# Patient Record
Sex: Female | Born: 1950
Health system: Southern US, Community
[De-identification: ages and names within clinical notes are randomized; demographics above are authoritative.]

## PROBLEM LIST (undated history)

## (undated) DIAGNOSIS — G459 Transient cerebral ischemic attack, unspecified: Secondary | ICD-10-CM

## (undated) DIAGNOSIS — E785 Hyperlipidemia, unspecified: Secondary | ICD-10-CM

## (undated) DIAGNOSIS — D649 Anemia, unspecified: Secondary | ICD-10-CM

## (undated) DIAGNOSIS — I10 Essential (primary) hypertension: Secondary | ICD-10-CM

## (undated) HISTORY — DX: Anemia, unspecified: D64.9

## (undated) HISTORY — DX: Transient cerebral ischemic attack, unspecified: G45.9

## (undated) HISTORY — DX: Essential (primary) hypertension: I10

## (undated) HISTORY — DX: Hyperlipidemia, unspecified: E78.5

## (undated) HISTORY — PX: OTHER SURGICAL HISTORY: SHX169

---

## 1999-09-24 ENCOUNTER — Encounter: Admission: RE | Admit: 1999-09-24 | Discharge: 1999-09-24 | Payer: Self-pay | Admitting: Family Medicine

## 1999-09-24 ENCOUNTER — Encounter: Payer: Self-pay | Admitting: Family Medicine

## 2000-10-06 ENCOUNTER — Other Ambulatory Visit: Admission: RE | Admit: 2000-10-06 | Discharge: 2000-10-06 | Payer: Self-pay | Admitting: *Deleted

## 2000-10-06 ENCOUNTER — Other Ambulatory Visit: Admission: RE | Admit: 2000-10-06 | Discharge: 2000-10-06 | Payer: Self-pay | Admitting: Family Medicine

## 2000-10-10 ENCOUNTER — Encounter: Admission: RE | Admit: 2000-10-10 | Discharge: 2000-10-10 | Payer: Self-pay | Admitting: Family Medicine

## 2000-10-10 ENCOUNTER — Encounter: Payer: Self-pay | Admitting: Family Medicine

## 2001-10-09 ENCOUNTER — Other Ambulatory Visit: Admission: RE | Admit: 2001-10-09 | Discharge: 2001-10-09 | Payer: Self-pay | Admitting: Obstetrics and Gynecology

## 2001-10-20 ENCOUNTER — Encounter: Payer: Self-pay | Admitting: Family Medicine

## 2001-10-20 ENCOUNTER — Encounter: Admission: RE | Admit: 2001-10-20 | Discharge: 2001-10-20 | Payer: Self-pay | Admitting: Family Medicine

## 2002-10-23 ENCOUNTER — Encounter: Payer: Self-pay | Admitting: Family Medicine

## 2002-10-23 ENCOUNTER — Encounter: Admission: RE | Admit: 2002-10-23 | Discharge: 2002-10-23 | Payer: Self-pay | Admitting: Family Medicine

## 2002-10-25 ENCOUNTER — Other Ambulatory Visit: Admission: RE | Admit: 2002-10-25 | Discharge: 2002-10-25 | Payer: Self-pay | Admitting: *Deleted

## 2002-11-08 DIAGNOSIS — G459 Transient cerebral ischemic attack, unspecified: Secondary | ICD-10-CM

## 2002-11-08 HISTORY — DX: Transient cerebral ischemic attack, unspecified: G45.9

## 2003-03-29 ENCOUNTER — Ambulatory Visit (HOSPITAL_COMMUNITY): Admission: RE | Admit: 2003-03-29 | Discharge: 2003-03-29 | Payer: Self-pay | Admitting: Gastroenterology

## 2003-10-29 ENCOUNTER — Encounter: Admission: RE | Admit: 2003-10-29 | Discharge: 2003-10-29 | Payer: Self-pay | Admitting: Family Medicine

## 2003-11-12 ENCOUNTER — Other Ambulatory Visit: Admission: RE | Admit: 2003-11-12 | Discharge: 2003-11-12 | Payer: Self-pay | Admitting: *Deleted

## 2004-10-29 ENCOUNTER — Encounter: Admission: RE | Admit: 2004-10-29 | Discharge: 2004-10-29 | Payer: Self-pay | Admitting: *Deleted

## 2004-10-30 ENCOUNTER — Other Ambulatory Visit: Admission: RE | Admit: 2004-10-30 | Discharge: 2004-10-30 | Payer: Self-pay | Admitting: *Deleted

## 2005-11-04 ENCOUNTER — Encounter: Admission: RE | Admit: 2005-11-04 | Discharge: 2005-11-04 | Payer: Self-pay | Admitting: Family Medicine

## 2005-11-10 ENCOUNTER — Other Ambulatory Visit: Admission: RE | Admit: 2005-11-10 | Discharge: 2005-11-10 | Payer: Self-pay | Admitting: *Deleted

## 2006-11-07 ENCOUNTER — Encounter: Admission: RE | Admit: 2006-11-07 | Discharge: 2006-11-07 | Payer: Self-pay | Admitting: Family Medicine

## 2006-11-17 ENCOUNTER — Encounter: Admission: RE | Admit: 2006-11-17 | Discharge: 2006-11-17 | Payer: Self-pay | Admitting: Family Medicine

## 2006-11-25 ENCOUNTER — Other Ambulatory Visit: Admission: RE | Admit: 2006-11-25 | Discharge: 2006-11-25 | Payer: Self-pay | Admitting: *Deleted

## 2007-07-21 ENCOUNTER — Ambulatory Visit: Payer: Self-pay | Admitting: Internal Medicine

## 2007-07-21 ENCOUNTER — Ambulatory Visit: Payer: Self-pay | Admitting: *Deleted

## 2007-07-21 LAB — CONVERTED CEMR LAB
Albumin: 4.3 g/dL (ref 3.5–5.2)
Alkaline Phosphatase: 106 units/L (ref 39–117)
Basophils Absolute: 0 10*3/uL (ref 0.0–0.1)
CO2: 29 meq/L (ref 19–32)
Calcium: 9.7 mg/dL (ref 8.4–10.5)
Cholesterol: 195 mg/dL (ref 0–200)
Creatinine, Ser: 1.07 mg/dL (ref 0.40–1.20)
Glucose, Bld: 67 mg/dL — ABNORMAL LOW (ref 70–99)
Hemoglobin: 13 g/dL (ref 12.0–15.0)
LDL Cholesterol: 99 mg/dL (ref 0–99)
Lymphs Abs: 2.7 10*3/uL (ref 0.7–3.3)
MCV: 87.6 fL (ref 78.0–100.0)
Monocytes Absolute: 0.4 10*3/uL (ref 0.2–0.7)
Monocytes Relative: 7 % (ref 3–11)
Neutro Abs: 2 10*3/uL (ref 1.7–7.7)
Neutrophils Relative %: 39 % — ABNORMAL LOW (ref 43–77)
RBC: 4.53 M/uL (ref 3.87–5.11)
Sodium: 142 meq/L (ref 135–145)
Total CHOL/HDL Ratio: 3.1
Total Protein: 8 g/dL (ref 6.0–8.3)

## 2007-10-20 ENCOUNTER — Ambulatory Visit: Payer: Self-pay | Admitting: Internal Medicine

## 2007-11-15 ENCOUNTER — Ambulatory Visit (HOSPITAL_COMMUNITY): Admission: RE | Admit: 2007-11-15 | Discharge: 2007-11-15 | Payer: Self-pay | Admitting: Family Medicine

## 2008-01-02 ENCOUNTER — Encounter: Payer: Self-pay | Admitting: Family Medicine

## 2008-01-02 ENCOUNTER — Ambulatory Visit: Payer: Self-pay | Admitting: Internal Medicine

## 2008-01-02 LAB — CONVERTED CEMR LAB
CO2: 26 meq/L (ref 19–32)
Calcium: 9.7 mg/dL (ref 8.4–10.5)
Chloride: 104 meq/L (ref 96–112)
Creatinine, Ser: 0.88 mg/dL (ref 0.40–1.20)
Eosinophils Absolute: 0.1 10*3/uL (ref 0.0–0.7)
Eosinophils Relative: 2 % (ref 0–5)
Glucose, Bld: 97 mg/dL (ref 70–99)
HCT: 41.9 % (ref 36.0–46.0)
Hemoglobin: 13.3 g/dL (ref 12.0–15.0)
Neutrophils Relative %: 40 % — ABNORMAL LOW (ref 43–77)
Platelets: 258 10*3/uL (ref 150–400)
Sodium: 142 meq/L (ref 135–145)
WBC: 4.7 10*3/uL (ref 4.0–10.5)

## 2008-11-19 ENCOUNTER — Ambulatory Visit (HOSPITAL_COMMUNITY): Admission: RE | Admit: 2008-11-19 | Discharge: 2008-11-19 | Payer: Self-pay | Admitting: Family Medicine

## 2008-12-23 ENCOUNTER — Encounter (INDEPENDENT_AMBULATORY_CARE_PROVIDER_SITE_OTHER): Payer: Self-pay | Admitting: Adult Health

## 2008-12-23 ENCOUNTER — Ambulatory Visit: Payer: Self-pay | Admitting: Internal Medicine

## 2008-12-23 LAB — CONVERTED CEMR LAB
AST: 18 units/L (ref 0–37)
Albumin: 4.4 g/dL (ref 3.5–5.2)
Basophils Relative: 1 % (ref 0–1)
Calcium: 9.9 mg/dL (ref 8.4–10.5)
Eosinophils Absolute: 0.1 10*3/uL (ref 0.0–0.7)
HCT: 36.2 % (ref 36.0–46.0)
HDL: 60 mg/dL (ref >39)
LDL Cholesterol: 107 mg/dL — ABNORMAL HIGH (ref 0–99)
Lymphocytes Relative: 48 % — ABNORMAL HIGH (ref 12–46)
MCHC: 33.4 g/dL (ref 30.0–36.0)
Monocytes Absolute: 0.5 10*3/uL (ref 0.1–1.0)
Neutrophils Relative %: 41 % — ABNORMAL LOW (ref 43–77)
Platelets: 259 10*3/uL (ref 150–400)
Potassium: 3.9 meq/L (ref 3.5–5.3)
RBC: 4.14 M/uL (ref 3.87–5.11)
Sodium: 141 meq/L (ref 135–145)
TSH: 1.888 microintl units/mL (ref 0.350–4.50)
Total Protein: 8.1 g/dL (ref 6.0–8.3)
VLDL: 18 mg/dL (ref 0–40)

## 2009-07-08 ENCOUNTER — Encounter (INDEPENDENT_AMBULATORY_CARE_PROVIDER_SITE_OTHER): Payer: Self-pay | Admitting: Adult Health

## 2009-07-08 ENCOUNTER — Ambulatory Visit: Payer: Self-pay | Admitting: Internal Medicine

## 2009-07-08 LAB — CONVERTED CEMR LAB
ALT: 9 units/L (ref 0–35)
AST: 17 units/L (ref 0–37)
Albumin: 4.1 g/dL (ref 3.5–5.2)
Alkaline Phosphatase: 78 units/L (ref 39–117)
Calcium: 9.3 mg/dL (ref 8.4–10.5)
Creatinine, Ser: 0.98 mg/dL (ref 0.40–1.20)
Glucose, Bld: 87 mg/dL (ref 70–99)
Sodium: 143 meq/L (ref 135–145)
Total Protein: 7.5 g/dL (ref 6.0–8.3)
Triglycerides: 73 mg/dL (ref <150)

## 2009-08-01 ENCOUNTER — Ambulatory Visit: Payer: Self-pay | Admitting: Internal Medicine

## 2009-08-01 ENCOUNTER — Encounter (INDEPENDENT_AMBULATORY_CARE_PROVIDER_SITE_OTHER): Payer: Self-pay | Admitting: Adult Health

## 2009-08-15 ENCOUNTER — Ambulatory Visit: Payer: Self-pay | Admitting: Internal Medicine

## 2009-11-20 ENCOUNTER — Ambulatory Visit (HOSPITAL_COMMUNITY): Admission: RE | Admit: 2009-11-20 | Discharge: 2009-11-20 | Payer: Self-pay | Admitting: Internal Medicine

## 2009-12-26 ENCOUNTER — Encounter (INDEPENDENT_AMBULATORY_CARE_PROVIDER_SITE_OTHER): Payer: Self-pay | Admitting: Adult Health

## 2009-12-26 ENCOUNTER — Ambulatory Visit: Payer: Self-pay | Admitting: Family Medicine

## 2009-12-26 LAB — CONVERTED CEMR LAB
ALT: 11 units/L (ref 0–35)
AST: 20 units/L (ref 0–37)
Basophils Absolute: 0 10*3/uL (ref 0.0–0.1)
CO2: 26 meq/L (ref 19–32)
Chloride: 105 meq/L (ref 96–112)
Cholesterol: 159 mg/dL (ref 0–200)
Eosinophils Absolute: 0.1 10*3/uL (ref 0.0–0.7)
Eosinophils Relative: 2 % (ref 0–5)
GC Probe Amp, Genital: NEGATIVE
HDL: 58 mg/dL (ref >39)
LDL Cholesterol: 82 mg/dL (ref 0–99)
Lymphocytes Relative: 46 % (ref 12–46)
Lymphs Abs: 2 10*3/uL (ref 0.7–4.0)
MCHC: 31.7 g/dL (ref 30.0–36.0)
MCV: 90.9 fL (ref 78.0–100.0)
Monocytes Relative: 11 % (ref 3–12)
Neutro Abs: 1.7 10*3/uL (ref 1.7–7.7)
Platelets: 244 10*3/uL (ref 150–400)
Potassium: 4 meq/L (ref 3.5–5.3)
RDW: 13.6 % (ref 11.5–15.5)
Triglycerides: 94 mg/dL (ref <150)

## 2010-04-20 ENCOUNTER — Ambulatory Visit: Payer: Self-pay | Admitting: Adult Health

## 2010-05-12 ENCOUNTER — Ambulatory Visit: Payer: Self-pay | Admitting: Internal Medicine

## 2010-05-14 ENCOUNTER — Ambulatory Visit: Payer: Self-pay | Admitting: Internal Medicine

## 2010-11-23 ENCOUNTER — Ambulatory Visit (HOSPITAL_COMMUNITY)
Admission: RE | Admit: 2010-11-23 | Discharge: 2010-11-23 | Payer: Self-pay | Source: Home / Self Care | Attending: Internal Medicine | Admitting: Internal Medicine

## 2010-11-29 ENCOUNTER — Encounter: Payer: Self-pay | Admitting: Family Medicine

## 2010-12-10 ENCOUNTER — Other Ambulatory Visit: Payer: Self-pay | Admitting: Family Medicine

## 2010-12-11 ENCOUNTER — Encounter (INDEPENDENT_AMBULATORY_CARE_PROVIDER_SITE_OTHER): Payer: Self-pay | Admitting: *Deleted

## 2010-12-11 LAB — CONVERTED CEMR LAB
ALT: 9 units/L (ref 0–35)
AST: 18 units/L (ref 0–37)
Albumin: 3.9 g/dL (ref 3.5–5.2)
Alkaline Phosphatase: 85 units/L (ref 39–117)
BUN: 17 mg/dL (ref 6–23)
Calcium: 9.4 mg/dL (ref 8.4–10.5)
Chlamydia, DNA Probe: NEGATIVE
Potassium: 3.5 meq/L (ref 3.5–5.3)
Total Bilirubin: 0.3 mg/dL (ref 0.3–1.2)
Total CHOL/HDL Ratio: 2.6
Total Protein: 7.5 g/dL (ref 6.0–8.3)
Triglycerides: 63 mg/dL (ref <150)

## 2011-03-26 NOTE — Op Note (Signed)
   [name], [name]                          ACCOUNT NO.:  [id number]   MEDICAL RECORD NO.:  [id number]                   PATIENT TYPE:  AMB   LOCATION:  ENDO                                 FACILITY:  [facility]   PHYSICIAN:  Graylin Shiver, M.D.                DATE OF BIRTH:  October 28, 1951   DATE OF PROCEDURE:  03/29/2003  DATE OF DISCHARGE:                                 OPERATIVE REPORT   PROCEDURE:  Colonoscopy.   INDICATION FOR PROCEDURE:  Screening for colon cancer.  Informed consent was  obtained.   PREMEDICATION:  Fentanyl 60 mcg IV, Versed 5 mg IV.   DESCRIPTION OF PROCEDURE:  With the patient in the left lateral decubitus  position, a rectal exam was performed and no masses were felt.  The Olympus  colonoscope was inserted into the rectum and advanced around the colon to  the cecum.  Cecal landmarks were identified.  The cecum and ascending colon  were normal.  The transverse colon was normal.  The descending colon,  sigmoid, and rectum were normal.  She tolerated the procedure well without  complications.   IMPRESSION:  Normal colonoscopy to the cecum.                                                Graylin Shiver, M.D.    [name]  D:  03/29/2003  T:  03/30/2003  Job:  [id number]   cc:   Chales Salmon. Abigail Miyamoto, M.D.  [address]  [place]  [place] [address]  Fax: [phone]

## 2011-10-19 ENCOUNTER — Other Ambulatory Visit (HOSPITAL_COMMUNITY): Payer: Self-pay | Admitting: Family Medicine

## 2011-10-19 DIAGNOSIS — Z1231 Encounter for screening mammogram for malignant neoplasm of breast: Secondary | ICD-10-CM

## 2011-11-25 ENCOUNTER — Ambulatory Visit (HOSPITAL_COMMUNITY)
Admission: RE | Admit: 2011-11-25 | Discharge: 2011-11-25 | Disposition: A | Discharge status: Home / Self Care | Payer: Self-pay | Source: Ambulatory Visit | Attending: Family Medicine | Admitting: Family Medicine

## 2011-11-25 DIAGNOSIS — Z1231 Encounter for screening mammogram for malignant neoplasm of breast: Secondary | ICD-10-CM | POA: Insufficient documentation

## 2011-12-28 ENCOUNTER — Other Ambulatory Visit: Payer: Self-pay | Admitting: Family Medicine

## 2012-10-17 ENCOUNTER — Encounter: Payer: Self-pay | Admitting: Family Medicine

## 2012-10-17 ENCOUNTER — Ambulatory Visit (INDEPENDENT_AMBULATORY_CARE_PROVIDER_SITE_OTHER): Payer: Self-pay | Admitting: Family Medicine

## 2012-10-17 VITALS — BP 128/72 | HR 84 | Temp 98.4°F | Ht 62.0 in | Wt 114.7 lb

## 2012-10-17 DIAGNOSIS — E785 Hyperlipidemia, unspecified: Secondary | ICD-10-CM

## 2012-10-17 DIAGNOSIS — I10 Essential (primary) hypertension: Secondary | ICD-10-CM | POA: Insufficient documentation

## 2012-10-17 DIAGNOSIS — Z8673 Personal history of transient ischemic attack (TIA), and cerebral infarction without residual deficits: Secondary | ICD-10-CM

## 2012-10-17 DIAGNOSIS — R011 Cardiac murmur, unspecified: Secondary | ICD-10-CM | POA: Insufficient documentation

## 2012-10-17 MED ORDER — LISINOPRIL-HYDROCHLOROTHIAZIDE 20-25 MG PO TABS: 1.0000 | ORAL_TABLET | Freq: Every day | ORAL | Status: DC

## 2012-10-17 NOTE — Assessment & Plan Note (Signed)
No symptoms continue medical management.

## 2012-10-17 NOTE — Assessment & Plan Note (Signed)
Fasting labs at physical appointment. Continue crestor for now. May consider change based on [facility] pharmacy formulary.

## 2012-10-17 NOTE — Progress Notes (Signed)
  Subjective:    Patient ID: Angela [name], female    DOB: Dec 27, 1950, 61 y.o.   MRN: [id number]  HPI [facility].  1. Hx TIA. In 2004, records suggest normal evaluation, had an echo showing small PFO. Has been on aspirin (plavix was dc'd) since then without recurrences.  2. HLD. On crestor. Last labs show LDL 108, HDL 44, TG 75. Nonsmoker. Does walking, running, weights for exercise. Denies chest pain, dyspnea, med side effect.  3. HTN. Strong family history. Taking med, needs refills before she runs out of lisinopril-HCTZ. Denies vision problems, fatigue, leg edema.  Past Medical History  Diagnosis Date  . TIA (transient ischemic attack) 2004  . Hypertension   . Hyperlipidemia    History reviewed. No pertinent past surgical history.  Family History  Problem Relation Age of Onset  . Cancer Mother 78    gallbladder  . Hypertension Mother   . Heart disease Father   . Hypertension Father   . Hypertension Brother    History   Social History  . Marital Status: Single    Spouse Name: N/A    Number of Children: N/A  . Years of Education: N/A   Occupational History  . Not on file.   Social History Main Topics  . Smoking status: Never Smoker   . Smokeless tobacco: Not on file  . Alcohol Use: No  . Drug Use: No  . Sexually Active:    Other Topics Concern  . Not on file   Social History Narrative   Single. 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    Review of Systems See HPI otherwise negative.  reports that she has never smoked. She does not have any smokeless tobacco history on file.     Objective:   Physical Exam  Vitals reviewed. Constitutional: She is oriented to person, place, and time. She appears well-developed and well-nourished. No distress.  HENT:  Head: Normocephalic and atraumatic.  Mouth/Throat: Oropharynx is clear and moist.  Eyes: Conjunctivae  normal and EOM are normal. Pupils are equal, round, and reactive to light.  Neck: Normal range of motion. Neck supple. No thyromegaly present.  Cardiovascular: Normal rate and regular rhythm.   Murmur heard.      II/VI sys murmur near apex. No carotid radiation.  Pulmonary/Chest: Effort normal and breath sounds normal. No respiratory distress. She has no wheezes. She has no rales.  Abdominal: Soft. Bowel sounds are normal. She exhibits no distension. There is no tenderness. There is no rebound.  Musculoskeletal: She exhibits no edema and no tenderness.  Lymphadenopathy:    She has no cervical adenopathy.  Neurological: She is alert and oriented to person, place, and time. Coordination normal.  Skin: No rash noted. She is not diaphoretic.  Psychiatric: She has a normal mood and affect.       Assessment & Plan:

## 2012-10-17 NOTE — Patient Instructions (Addendum)
Nice to meet you. Please make appointment with Britta Mccreedy for orange card. We can do a physical once you get this. labwork after you get orange card. Keep up good work with exercise!  Health Maintenance, Females A healthy lifestyle and preventative care can promote health and wellness.  Maintain regular health, dental, and eye exams.  Eat a healthy diet. Foods like vegetables, fruits, whole grains, low-fat dairy products, and lean protein foods contain the nutrients you need without too many calories. Decrease your intake of foods high in solid fats, added sugars, and salt. Get information about a proper diet from your caregiver, if necessary.  Regular physical exercise is one of the most important things you can do for your health. Most adults should get at least 150 minutes of moderate-intensity exercise (any activity that increases your heart rate and causes you to sweat) each week. In addition, most adults need muscle-strengthening exercises on 2 or more days a week.   Maintain a healthy weight. The body mass index (BMI) is a screening tool to identify possible weight problems. It provides an estimate of body fat based on height and weight. Your caregiver can help determine your BMI, and can help you achieve or maintain a healthy weight. For adults 20 years and older:  A BMI below 18.5 is considered underweight.  A BMI of 18.5 to 24.9 is normal.  A BMI of 25 to 29.9 is considered overweight.  A BMI of 30 and above is considered obese.  Maintain normal blood lipids and cholesterol by exercising and minimizing your intake of saturated fat. Eat a balanced diet with plenty of fruits and vegetables. Blood tests for lipids and cholesterol should begin at age 9 and be repeated every 5 years. If your lipid or cholesterol levels are high, you are over 50, or you are a high risk for heart disease, you may need your cholesterol levels checked more frequently.Ongoing high lipid and cholesterol  levels should be treated with medicines if diet and exercise are not effective.  If you smoke, find out from your caregiver how to quit. If you do not use tobacco, do not start.  If you are pregnant, do not drink alcohol. If you are breastfeeding, be very cautious about drinking alcohol. If you are not pregnant and choose to drink alcohol, do not exceed 1 drink per day. One drink is considered to be 12 ounces (355 mL) of beer, 5 ounces (148 mL) of wine, or 1.5 ounces (44 mL) of liquor.  Avoid use of street drugs. Do not share needles with anyone. Ask for help if you need support or instructions about stopping the use of drugs.  High blood pressure causes heart disease and increases the risk of stroke. Blood pressure should be checked at least every 1 to 2 years. Ongoing high blood pressure should be treated with medicines, if weight loss and exercise are not effective.  If you are 52 to 60 years old, ask your caregiver if you should take aspirin to prevent strokes.  Diabetes screening involves taking a blood sample to check your fasting blood sugar level. This should be done once every 3 years, after age 46, if you are within normal weight and without risk factors for diabetes. Testing should be considered at a younger age or be carried out more frequently if you are overweight and have at least 1 risk factor for diabetes.  Breast cancer screening is essential preventative care for women. You should practice "breast self-awareness." This means understanding  the normal appearance and feel of your breasts and may include breast self-examination. Any changes detected, no matter how small, should be reported to a caregiver. Women in their 19s and 30s should have a clinical breast exam (CBE) by a caregiver as part of a regular health exam every 1 to 3 years. After age 91, women should have a CBE every year. Starting at age 72, women should consider having a mammogram (breast X-ray) every year. Women who  have a family history of breast cancer should talk to their caregiver about genetic screening. Women at a high risk of breast cancer should talk to their caregiver about having an MRI and a mammogram every year.  The Pap test is a screening test for cervical cancer. Women should have a Pap test starting at age 59. Between ages 53 and 1, Pap tests should be repeated every 2 years. Beginning at age 33, you should have a Pap test every 3 years as long as the past 3 Pap tests have been normal. If you had a hysterectomy for a problem that was not cancer or a condition that could lead to cancer, then you no longer need Pap tests. If you are between ages 60 and 47, and you have had normal Pap tests going back 10 years, you no longer need Pap tests. If you have had past treatment for cervical cancer or a condition that could lead to cancer, you need Pap tests and screening for cancer for at least 20 years after your treatment. If Pap tests have been discontinued, risk factors (such as a new sexual partner) need to be reassessed to determine if screening should be resumed. Some women have medical problems that increase the chance of getting cervical cancer. In these cases, your caregiver may recommend more frequent screening and Pap tests.  The human papillomavirus (HPV) test is an additional test that may be used for cervical cancer screening. The HPV test looks for the virus that can cause the cell changes on the cervix. The cells collected during the Pap test can be tested for HPV. The HPV test could be used to screen women aged 73 years and older, and should be used in women of any age who have unclear Pap test results. After the age of 75, women should have HPV testing at the same frequency as a Pap test.  Colorectal cancer can be detected and often prevented. Most routine colorectal cancer screening begins at the age of 104 and continues through age 84. However, your caregiver may recommend screening at an  earlier age if you have risk factors for colon cancer. On a yearly basis, your caregiver may provide home test kits to check for hidden blood in the stool. Use of a small camera at the end of a tube, to directly examine the colon (sigmoidoscopy or colonoscopy), can detect the earliest forms of colorectal cancer. Talk to your caregiver about this at age 51, when routine screening begins. Direct examination of the colon should be repeated every 5 to 10 years through age 15, unless early forms of pre-cancerous polyps or small growths are found.  Hepatitis C blood testing is recommended for all people born from [birth year] through 1965 and any individual with known risks for hepatitis C.  Practice safe sex. Use condoms and avoid high-risk sexual practices to reduce the spread of sexually transmitted infections (STIs). Sexually active women aged 15 and younger should be checked for Chlamydia, which is a common sexually transmitted infection. Older women  with new or multiple partners should also be tested for Chlamydia. Testing for other STIs is recommended if you are sexually active and at increased risk.  Osteoporosis is a disease in which the bones lose minerals and strength with aging. This can result in serious bone fractures. The risk of osteoporosis can be identified using a bone density scan. Women ages 79 and over and women at risk for fractures or osteoporosis should discuss screening with their caregivers. Ask your caregiver whether you should be taking a calcium supplement or vitamin D to reduce the rate of osteoporosis.  Menopause can be associated with physical symptoms and risks. Hormone replacement therapy is available to decrease symptoms and risks. You should talk to your caregiver about whether hormone replacement therapy is right for you.  Use sunscreen with a sun protection factor (SPF) of 30 or greater. Apply sunscreen liberally and repeatedly throughout the day. You should seek shade when your  shadow is shorter than you. Protect yourself by wearing long sleeves, pants, a wide-brimmed hat, and sunglasses year round, whenever you are outdoors.  Notify your caregiver of new moles or changes in moles, especially if there is a change in shape or color. Also notify your caregiver if a mole is larger than the size of a pencil eraser.  Stay current with your immunizations. Document Released: 05/10/2011 Document Revised: 01/17/2012 Document Reviewed: 05/10/2011 [facility] Patient Information 2013 [place], [place].

## 2012-10-17 NOTE — Assessment & Plan Note (Signed)
At goal today. Given rx for medication refills. Will check labs next visit after she gets orange card.

## 2012-10-18 ENCOUNTER — Other Ambulatory Visit: Payer: Self-pay | Admitting: Family Medicine

## 2012-11-09 ENCOUNTER — Other Ambulatory Visit: Payer: Self-pay | Admitting: Family Medicine

## 2012-11-09 MED ORDER — ROSUVASTATIN CALCIUM 20 MG PO TABS: 20.0000 mg | ORAL_TABLET | Freq: Every day | ORAL | Status: DC

## 2012-11-09 NOTE — Telephone Encounter (Signed)
Pt is needing refill on her Crestor (this one has not been filled by [facility] yet) and needs it called to [facility] [address]

## 2012-11-09 NOTE — Telephone Encounter (Signed)
rx sent to [facility]. Notify pt please.

## 2012-11-09 NOTE — Telephone Encounter (Signed)
Spoke with patient and informed her of below 

## 2012-11-13 ENCOUNTER — Other Ambulatory Visit: Payer: Self-pay | Admitting: Family Medicine

## 2012-11-13 ENCOUNTER — Telehealth: Payer: Self-pay | Admitting: Family Medicine

## 2012-11-13 MED ORDER — PRAVASTATIN SODIUM 40 MG PO TABS: 40.0000 mg | ORAL_TABLET | Freq: Every day | ORAL | Status: DC

## 2012-11-13 NOTE — Telephone Encounter (Signed)
Patient went to pick up the Rx for Crestor and it was too expensive and she is hoping that there is a generic instead.  Rosuvastatin Calcium is what she was getting when she was at [facility].  Please call patient to let her know if this can happen and what the name of the med to replace Crestor is.

## 2012-11-13 NOTE — Telephone Encounter (Signed)
Patient is calling back to check the status of the change in medication that she has requested.

## 2012-11-13 NOTE — Telephone Encounter (Signed)
Can change to pravastatin. Sent to [facility]. Please notify patient on 4$ list.

## 2012-11-14 NOTE — Telephone Encounter (Signed)
Spoke with patient and informed her of below 

## 2013-02-16 ENCOUNTER — Encounter (HOSPITAL_COMMUNITY): Payer: Self-pay

## 2013-02-16 ENCOUNTER — Emergency Department (HOSPITAL_COMMUNITY)
Admission: EM | Admit: 2013-02-16 | Discharge: 2013-02-16 | Disposition: A | Discharge status: Home / Self Care | Payer: No Typology Code available for payment source | Source: Home / Self Care | Attending: Family Medicine | Admitting: Family Medicine

## 2013-02-16 ENCOUNTER — Other Ambulatory Visit: Payer: Self-pay | Admitting: Family Medicine

## 2013-02-16 DIAGNOSIS — Z8673 Personal history of transient ischemic attack (TIA), and cerebral infarction without residual deficits: Secondary | ICD-10-CM

## 2013-02-16 DIAGNOSIS — R011 Cardiac murmur, unspecified: Secondary | ICD-10-CM

## 2013-02-16 DIAGNOSIS — Z1231 Encounter for screening mammogram for malignant neoplasm of breast: Secondary | ICD-10-CM

## 2013-02-16 DIAGNOSIS — E785 Hyperlipidemia, unspecified: Secondary | ICD-10-CM

## 2013-02-16 DIAGNOSIS — I10 Essential (primary) hypertension: Secondary | ICD-10-CM

## 2013-02-16 LAB — LIPID PANEL: Cholesterol: 156 mg/dL (ref 0–200)

## 2013-02-16 LAB — CBC
Hemoglobin: 11.6 g/dL — ABNORMAL LOW (ref 12.0–15.0)
MCH: 28.5 pg (ref 26.0–34.0)
MCV: 85 fL (ref 78.0–100.0)

## 2013-02-16 LAB — COMPREHENSIVE METABOLIC PANEL
ALT: 19 U/L (ref 0–35)
Albumin: 3.6 g/dL (ref 3.5–5.2)
Alkaline Phosphatase: 89 U/L (ref 39–117)
BUN: 19 mg/dL (ref 6–23)
GFR calc Af Amer: 78 mL/min — ABNORMAL LOW (ref >90)
Glucose, Bld: 90 mg/dL (ref 70–99)
Potassium: 4 mEq/L (ref 3.5–5.1)
Sodium: 140 mEq/L (ref 135–145)

## 2013-02-16 LAB — HEMOGLOBIN A1C: Hgb A1c MFr Bld: 5.9 % — ABNORMAL HIGH (ref <5.7)

## 2013-02-16 MED ORDER — PRAVASTATIN SODIUM 40 MG PO TABS: 40.0000 mg | ORAL_TABLET | Freq: Every day | ORAL | Status: DC

## 2013-02-16 MED ORDER — ROSUVASTATIN CALCIUM 20 MG PO TABS: 20.0000 mg | ORAL_TABLET | Freq: Every day | ORAL | Status: DC

## 2013-02-16 MED ORDER — LISINOPRIL-HYDROCHLOROTHIAZIDE 20-12.5 MG PO TABS: 1.0000 | ORAL_TABLET | Freq: Every day | ORAL | Status: DC

## 2013-02-16 NOTE — ED Notes (Signed)
Patient here to establish care and get a physical Has history of HTN High cholesterol

## 2013-02-16 NOTE — ED Provider Notes (Addendum)
History     CSN: [id number]  Arrival date & time 02/16/13  1020   First MD Initiated Contact with Patient 02/16/13 1036      Chief Complaint  Patient presents with  . Establish Care   HPI Pt is reporting that she is here to get established.  She has been going to family medicine but is reporting that she is coming here because they did not do anything over there.  She says that her HTN has been controlled and her hyperlipidemia needs to be checked.  No complaints and pt reports that she did get her orange discount card.     Past Medical History  Diagnosis Date  . TIA (transient ischemic attack) 2004  . Hypertension   . Hyperlipidemia     History reviewed. No pertinent past surgical history.  Family History  Problem Relation Age of Onset  . Cancer Mother 68    gallbladder  . Hypertension Mother   . Heart disease Father   . Hypertension Father   . Hypertension Brother     History  Substance Use Topics  . Smoking status: Never Smoker   . Smokeless tobacco: Not on file  . Alcohol Use: No    OB History   Grav Para Term Preterm Abortions TAB SAB Ect Mult Living                  Review of Systems  Constitutional: Negative.  HENT: Negative.  Respiratory: Negative.  Cardiovascular: Negative.  Gastrointestinal: Negative.  Endocrine: Negative.  Genitourinary: Negative.  Musculoskeletal: Negative.  Skin: Negative.  Allergic/Immunologic: Negative.  Neurological: Negative.  Hematological: Negative.  Psychiatric/Behavioral: Negative.  All other systems reviewed and are negative   Allergies  Review of patient's allergies indicates no known allergies.  Home Medications   Current Outpatient Rx  Name  Route  Sig  Dispense  Refill  . Multiple Vitamin (MULTIVITAMIN) tablet   Oral   Take 1 tablet by mouth daily.         . rosuvastatin (CRESTOR) 20 MG tablet   Oral   Take 20 mg by mouth daily.         Marland Kitchen aspirin 325 MG tablet   Oral   Take 325 mg by mouth  daily.         . calcium carbonate (OS-CAL) 600 MG TABS   Oral   Take 600 mg by mouth 2 (two) times daily with a meal.         . lisinopril-hydrochlorothiazide (PRINZIDE,ZESTORETIC) 20-25 MG per tablet   Oral   Take 1 tablet by mouth daily.   30 tablet   2   . pravastatin (PRAVACHOL) 40 MG tablet   Oral   Take 1 tablet (40 mg total) by mouth daily.   90 tablet   3     BP 125/76  Pulse 97  Temp(Src) 97.7 F (36.5 C) (Oral)  Resp 18  SpO2 100%  Physical Exam Nursing note and vitals reviewed.  Constitutional: She is oriented to person, place, and time. She appears well-developed and well-nourished. No distress.  HENT:  Head: Normocephalic and atraumatic.  Eyes: Conjunctivae and EOM are normal. Pupils are equal, round, and reactive to light.  Neck: Normal range of motion. Neck supple. No JVD present. No tracheal deviation present. No thyromegaly present.  Cardiovascular: Normal rate, regular rhythm and I-II/VI sys murmur  Pulmonary/Chest: Effort normal and breath sounds normal. No respiratory distress. She has no wheezes.  Abdominal: Soft. Bowel sounds  are normal.  Musculoskeletal: Normal range of motion. She exhibits no edema and no tenderness.  Lymphadenopathy:  She has no cervical adenopathy.  Neurological: She is alert and oriented to person, place, and time. She has normal reflexes.  Skin: Skin is warm and dry.  Psychiatric: She has a normal mood and affect. Her behavior is normal. Judgment and thought content normal.   ED Course  Procedures (including critical care time)  Labs Reviewed - No data to display No results found.  No diagnosis found.  MDM  IMPRESSION  Hypertension, controlled  History of hypokalemia   History of PFO  Hyperlipidemia  Health Maintenance  History of TIA   RECOMMENDATIONS / PLAN Pt not able to afford crestor but is going to [facility] to try and get the medication from [facility].  Pt will be given Rx for pravastatin so that  she can afford to take something until she gets her Crestor prescription.  STOP Pravastatin when she gets the crestor.  The patient verbalized understanding.  Follow up on lab results.  Refilled zestoretic 20/12.5 take 1 po daily, #30, RFx3.   Check labs today Continue daily aspirin  FOLLOW UP 4 months  The patient was given clear instructions to go to ER or return to medical center if symptoms don't improve, worsen or new problems develop.  The patient verbalized understanding.  The patient was told to call to get lab results if they haven't heard anything in the next week.           Cleora Fleet, MD 02/16/13 1106  Ilyse Tremain Cyndie Mull, MD 02/16/13 1109

## 2013-02-17 ENCOUNTER — Encounter: Payer: Self-pay | Admitting: *Deleted

## 2013-02-17 NOTE — Progress Notes (Signed)
  Subjective:    Patient ID: Angela [name], female    DOB: Oct 25, 1951, 62 y.o.   MRN: [id number]  HPI    Review of Systems     Objective:   Physical Exam        Assessment & Plan:  Since patient is not happy with care at [facility] and is a former [facility] patient, I ill remove Dr. Cristal Ford as PCP and make sure [facility].

## 2013-02-18 ENCOUNTER — Encounter: Payer: Self-pay | Admitting: Family Medicine

## 2013-02-18 DIAGNOSIS — R7303 Prediabetes: Secondary | ICD-10-CM | POA: Insufficient documentation

## 2013-02-18 NOTE — Progress Notes (Signed)
Quick Note:  Please inform patient that her Hemoglobin A1c came back elevated suggesting that she has prediabetes. Please mail patient information about prediabetes and recommend exercising 30 mins at least 5 times per week. Her hemoglobin level was a little low. Other labs OK. Please recheck labs in 3 months.   Rodney Langton, MD, CDE, FAAFP [facility] [place], [place]   ______

## 2013-02-19 ENCOUNTER — Telehealth (HOSPITAL_COMMUNITY): Payer: Self-pay

## 2013-02-19 NOTE — ED Notes (Signed)
Spoke with patient. Lab results given.

## 2013-02-19 NOTE — ED Notes (Signed)
[facility]

## 2013-02-26 ENCOUNTER — Telehealth (HOSPITAL_COMMUNITY): Payer: Self-pay | Admitting: *Deleted

## 2013-02-26 NOTE — ED Notes (Signed)
Pt. called on [facility] VM.  I called pt. back and told her this was the [facility]. She said [facility] has not called her with appt. for pap smear.  I accessed pt.'s chart and noted that [facility]. I told her I could see that they faxed the referral request on 4/14. I told the [facility] booked [facility] 2-3 mos., so it may take awhile for her to get seen there. I told her to call back [phone] in 2 weeks if she has not gotten her appointment. Vassie Moselle 02/26/2013

## 2013-02-28 ENCOUNTER — Ambulatory Visit (HOSPITAL_COMMUNITY)
Admission: RE | Admit: 2013-02-28 | Discharge: 2013-02-28 | Disposition: A | Discharge status: Home / Self Care | Payer: No Typology Code available for payment source | Source: Ambulatory Visit | Attending: Family Medicine | Admitting: Family Medicine

## 2013-02-28 DIAGNOSIS — Z1231 Encounter for screening mammogram for malignant neoplasm of breast: Secondary | ICD-10-CM | POA: Insufficient documentation

## 2013-03-21 ENCOUNTER — Ambulatory Visit (INDEPENDENT_AMBULATORY_CARE_PROVIDER_SITE_OTHER): Payer: No Typology Code available for payment source | Admitting: Obstetrics & Gynecology

## 2013-03-21 ENCOUNTER — Encounter: Payer: Self-pay | Admitting: Obstetrics & Gynecology

## 2013-03-21 DIAGNOSIS — Z01419 Encounter for gynecological examination (general) (routine) without abnormal findings: Secondary | ICD-10-CM

## 2013-03-21 NOTE — Patient Instructions (Signed)
Return to clinic for any scheduled appointments or for any gynecologic concerns as needed.   

## 2013-03-21 NOTE — Progress Notes (Signed)
Patient decided she is uptodate with her cervical dysplasia as she had three normal pap smears in last three years.  Will return as indicated.  No other GYN concerns.

## 2013-08-09 ENCOUNTER — Ambulatory Visit: Payer: No Typology Code available for payment source | Attending: Internal Medicine

## 2013-08-09 ENCOUNTER — Ambulatory Visit: Payer: No Typology Code available for payment source | Attending: Family Medicine | Admitting: Family Medicine

## 2013-08-09 VITALS — BP 151/79 | HR 81 | Temp 98.3°F | Resp 16 | Ht 62.0 in | Wt 112.0 lb

## 2013-08-09 DIAGNOSIS — R7303 Prediabetes: Secondary | ICD-10-CM

## 2013-08-09 DIAGNOSIS — I10 Essential (primary) hypertension: Secondary | ICD-10-CM

## 2013-08-09 DIAGNOSIS — E785 Hyperlipidemia, unspecified: Secondary | ICD-10-CM

## 2013-08-09 DIAGNOSIS — R011 Cardiac murmur, unspecified: Secondary | ICD-10-CM

## 2013-08-09 DIAGNOSIS — R7309 Other abnormal glucose: Secondary | ICD-10-CM

## 2013-08-09 MED ORDER — LISINOPRIL-HYDROCHLOROTHIAZIDE 20-12.5 MG PO TABS: 1.0000 | ORAL_TABLET | Freq: Every day | ORAL | Status: DC

## 2013-08-09 MED ORDER — SIMVASTATIN 20 MG PO TABS: 20.0000 mg | ORAL_TABLET | Freq: Every day | ORAL | Status: DC

## 2013-08-09 MED ORDER — FLUTICASONE PROPIONATE 50 MCG/ACT NA SUSP: 2.0000 | Freq: Every day | NASAL | Status: DC

## 2013-08-09 NOTE — Progress Notes (Signed)
Patient ID: Angela [name], female   DOB: 1951-08-07, 62 y.o.   MRN: [id number]  CC:  Follow up   HPI: Pt is presenting to follow up for her hypertension. She wants to switch to using this pharmacy.  She has some itching in her ears and wants to have it checked.  Pt requesting refills on her medications and pt is reporting no Cp or SOB.  She says that she has been well.    No Known Allergies Past Medical History  Diagnosis Date  . TIA (transient ischemic attack) 2004  . Hypertension   . Hyperlipidemia    Current Outpatient Prescriptions on File Prior to Visit  Medication Sig Dispense Refill  . aspirin 325 MG tablet Take 325 mg by mouth daily.      . calcium carbonate (OS-CAL) 600 MG TABS Take 600 mg by mouth 2 (two) times daily with a meal.      . Multiple Vitamin (MULTIVITAMIN) tablet Take 1 tablet by mouth daily.       No current facility-administered medications on file prior to visit.   Family History  Problem Relation Age of Onset  . Cancer Mother 61    gallbladder  . Hypertension Mother   . Heart disease Father   . Hypertension Father   . Hypertension Brother    History   Social History  . Marital Status: Single    Spouse Name: N/A    Number of Children: N/A  . Years of Education: N/A   Occupational History  . Not on file.   Social History Main Topics  . Smoking status: Never Smoker   . Smokeless tobacco: Not on file  . Alcohol Use: No  . Drug Use: No  . Sexual Activity:    Other Topics Concern  . Not on file   Social History Narrative   Single. 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     Review of Systems  Constitutional: Negative for fever, chills, diaphoresis, activity change, appetite change and fatigue.  HENT: Negative for ear pain, nosebleeds, congestion, facial swelling, rhinorrhea, neck pain, neck stiffness and ear discharge.   Eyes: Negative for pain, discharge, redness,  itching and visual disturbance.  Respiratory: Negative for cough, choking, chest tightness, shortness of breath, wheezing and stridor.   Cardiovascular: Negative for chest pain, palpitations and leg swelling.  Gastrointestinal: Negative for abdominal distention.  Genitourinary: Negative for dysuria, urgency, frequency, hematuria, flank pain, decreased urine volume, difficulty urinating and dyspareunia.  Musculoskeletal: Negative for back pain, joint swelling, arthralgias and gait problem.  Neurological: Negative for dizziness, tremors, seizures, syncope, facial asymmetry, speech difficulty, weakness, light-headedness, numbness and headaches.  Hematological: Negative for adenopathy. Does not bruise/bleed easily.  Psychiatric/Behavioral: Negative for hallucinations, behavioral problems, confusion, dysphoric mood, decreased concentration and agitation.    Objective:   Filed Vitals:   08/09/13 1722  BP: 151/79  Pulse: 81  Temp: 98.3 F (36.8 C)  Resp: 16    Physical Exam  Constitutional: Appears well-developed and well-nourished. No distress.  HENT: Normocephalic. External right and left ear canal with mod dry wax.  Swollen nasal turbinates bilateral. Oropharynx is clear and moist.  Eyes: Conjunctivae and EOM are normal. PERRLA, no scleral icterus.  Neck: Normal ROM. Neck supple. No JVD. No tracheal deviation. No thyromegaly.  CVS: RRR, S1/S2 +, no murmurs, no gallops, no carotid bruit.  Pulmonary: Effort and breath sounds normal, no stridor, rhonchi, wheezes, rales.  Abdominal: Soft. BS +,  no distension, tenderness, rebound or guarding.  Musculoskeletal: Normal range of motion. No edema and no tenderness.  Lymphadenopathy: No lymphadenopathy noted, cervical, inguinal. Neuro: Alert. Normal reflexes, muscle tone coordination. No cranial nerve deficit. Skin: Skin is warm and dry. No rash noted. Not diaphoretic. No erythema. No pallor.  Psychiatric: Normal mood and affect. Behavior,  judgment, thought content normal.   Lab Results  Component Value Date   WBC 4.2 02/16/2013   HGB 11.6* 02/16/2013   HCT 34.6* 02/16/2013   MCV 85.0 02/16/2013   PLT 260 02/16/2013   Lab Results  Component Value Date   CREATININE 0.91 02/16/2013   BUN 19 02/16/2013   NA 140 02/16/2013   K 4.0 02/16/2013   CL 103 02/16/2013   CO2 29 02/16/2013    Lab Results  Component Value Date   HGBA1C 5.9* 02/16/2013   Lipid Panel     Component Value Date/Time   CHOL 156 02/16/2013 1057   TRIG 54 02/16/2013 1057   HDL 54 02/16/2013 1057   CHOLHDL 2.9 02/16/2013 1057   VLDL 11 02/16/2013 1057   LDLCALC 91 02/16/2013 1057    Assessment and plan:   Patient Active Problem List   Diagnosis Date Noted  . Prediabetes 02/18/2013  . Dyslipidemia 02/16/2013  . Essential hypertension 10/17/2012  . Hyperlipidemia 10/17/2012  . Hx-TIA (transient ischemic attack) 10/17/2012  . Heart murmur 10/17/2012   Prediabetes - Plan: CBC, COMPLETE METABOLIC PANEL WITH GFR, Lipid panel  Hyperlipidemia - Plan: CBC, COMPLETE METABOLIC PANEL WITH GFR, Lipid panel  Dyslipidemia - Plan: CBC, COMPLETE METABOLIC PANEL WITH GFR, Lipid panel  Essential hypertension - Plan: CBC, COMPLETE METABOLIC PANEL WITH GFR, Lipid panel  Heart murmur - Plan: CBC, COMPLETE METABOLIC PANEL WITH GFR, Lipid panel  Pt declined flu vaccine  Flonase nasal spray   Allergic rhinitis counseling done today  OTC ear drops recommended for left ear.   Follow labs  RTC in 4 months  The patient was given clear instructions to go to ER or return to medical center if symptoms don't improve, worsen or new problems develop.  The patient verbalized understanding.  The patient was told to call to get any lab results if not heard anything in the next week.    Rodney Langton, MD, CDE, FAAFP [facility] [place], [place]

## 2013-08-09 NOTE — Patient Instructions (Signed)
Cholesterol Cholesterol is a type of fat. Your body needs a small amount of cholesterol, but too much can cause health problems. Certain problems include heart attacks, strokes, and not enough blood flow to your heart, brain, kidneys, or feet. You get cholesterol in 2 ways:  Naturally.  By eating certain foods. HOME CARE  Eat a low-fat diet:  Eat less eggs, whole dairy products (whole milk, cheese, and butter), fatty meats, and fried foods.  Eat more fruits, vegetables, whole-wheat breads, lean chicken, and fish.  Follow your exercise program as told by your doctor.  Keep your weight at a healthy level. Talk to your doctor about what is right for you.  Only take medicine as told by your doctor.  Get your cholesterol checked once a year or as told by your doctor. MAKE SURE YOU:  Understand these instructions.  Will watch your condition.  Will get help right away if you are not doing well or get worse. Document Released: 01/21/2009 Document Revised: 01/17/2012 Document Reviewed: 01/21/2009 [facility] Patient Information 2014 [place], [place]. Hypertension Hypertension is another name for high blood pressure. High blood pressure may mean that your heart needs to work harder to pump blood. Blood pressure consists of two numbers, which includes a higher number over a lower number (example: 110/72). HOME CARE   Make lifestyle changes as told by your doctor. This may include weight loss and exercise.  Take your blood pressure medicine every day.  Limit how much salt you use.  Stop smoking if you smoke.  Do not use drugs.  Talk to your doctor if you are using decongestants or birth control pills. These medicines might make blood pressure higher.  Females should not drink more than 1 alcoholic drink per day. Males should not drink more than 2 alcoholic drinks per day.  See your doctor as told. GET HELP RIGHT AWAY IF:   You have a blood pressure reading with a top number of 180  or higher.  You get a very bad headache.  You get blurred or changing vision.  You feel confused.  You feel weak, numb, or faint.  You get chest or belly (abdominal) pain.  You throw up (vomit).  You cannot breathe very well. MAKE SURE YOU:   Understand these instructions.  Will watch your condition.  Will get help right away if you are not doing well or get worse. Document Released: 04/12/2008 Document Revised: 01/17/2012 Document Reviewed: 04/12/2008 [facility] Patient Information 2014 [place], [place]. Cerumen Plug A cerumen plug is having too much wax in your ear canal. The outer ear canal is lined with hairs and glands that secrete wax. This wax is called cerumen. This protects the ear canal. It also helps prevent material from entering the ear. Too much wax can cause a feeling of fullness in the ears, decreased hearing, ringing in the ears, or an earache. Sometimes your caregiver will remove a cerumen plug with an instrument called a curette. Or he/she may flush the ear canal with warm water from a syringe to remove the wax. You may simply be sent home to follow the home care instructions below for wax removal. Generally ear wax does not have to be removed unless it is causing a problem such as one of those listed above. When too much wax is causing a problem, the following are a few home remedies which can be used to help this problem. HOME CARE INSTRUCTIONS   Put a couple drops of glycerin, baby oil, or mineral  oil in the ear a couple times of day. Do this every day for several days. After putting the drops in, you will need to lay with the affected ear pointing up for a couple minutes. This allows the drops to remain in the canal and run down to the area of wax blockage. This will soften the wax plug. It may also make your hearing worse as the wax softens and blocks the canal even more.  After a couple days, you may gently flush the ear canal with warm water from a syringe. Do  this by pulling your ear up and back with your head tilted slightly forward and towards a pan to catch the water. This is most easily done with a helper. You can also accomplish the same thing by letting the shower beat into your ear canal to wash the wax out. Sometimes this will not be immediately successful. You will have to return to the first step of using the oil to further soften the wax. Then resume washing the ear canal out with a syringe or shower.  Following removal of the wax, put ten to twenty drops of rubbing alcohol into the outer ears. This will dry the canal and prevent an infection.  Do not irrigate or wash out your ears if you have had a perforated ear drum or mastoid surgery. SEEK IMMEDIATE MEDICAL CARE IF:   You are unsuccessful with the above instructions for home care.  You develop ear pain or drainage from the ear. MAKE SURE YOU:   Understand these instructions.  Will watch your condition.  Will get help right away if you are not doing well or get worse. Document Released: 07/20/2001 Document Revised: 01/17/2012 Document Reviewed: 10/16/2008 [facility] Patient Information 2014 [place], [place]. Allergic Rhinitis Allergic rhinitis is when the mucous membranes in the nose respond to allergens. Allergens are particles in the air that cause your body to have an allergic reaction. This causes you to release allergic antibodies. Through a chain of events, these eventually cause you to release histamine into the blood stream (hence the use of antihistamines). Although meant to be protective to the body, it is this release that causes your discomfort, such as frequent sneezing, congestion and an itchy runny nose.  CAUSES  The pollen allergens may come from grasses, trees, and weeds. This is seasonal allergic rhinitis, or "hay fever." Other allergens cause year-round allergic rhinitis (perennial allergic rhinitis) such as house dust mite allergen, pet dander and mold spores.    SYMPTOMS   Nasal stuffiness (congestion).  Runny, itchy nose with sneezing and tearing of the eyes.  There is often an itching of the mouth, eyes and ears. It cannot be cured, but it can be controlled with medications. DIAGNOSIS  If you are unable to determine the offending allergen, skin or blood testing may find it. TREATMENT   Avoid the allergen.  Medications and allergy shots (immunotherapy) can help.  Hay fever may often be treated with antihistamines in pill or nasal spray forms. Antihistamines block the effects of histamine. There are over-the-counter medicines that may help with nasal congestion and swelling around the eyes. Check with your caregiver before taking or giving this medicine. If the treatment above does not work, there are many new medications your caregiver can prescribe. Stronger medications may be used if initial measures are ineffective. Desensitizing injections can be used if medications and avoidance fails. Desensitization is when a patient is given ongoing shots until the body becomes less sensitive to  the allergen. Make sure you follow up with your caregiver if problems continue. SEEK MEDICAL CARE IF:   You develop fever (more than 100.5 F (38.1 C).  You develop a cough that does not stop easily (persistent).  You have shortness of breath.  You start wheezing.  Symptoms interfere with normal daily activities. Document Released: 07/20/2001 Document Revised: 01/17/2012 Document Reviewed: 01/29/2009 [facility] Patient Information 2014 [place], [place].

## 2013-08-10 ENCOUNTER — Encounter: Payer: Self-pay | Admitting: Family Medicine

## 2013-08-15 ENCOUNTER — Telehealth: Payer: Self-pay

## 2013-08-15 NOTE — Telephone Encounter (Signed)
Message copied by Lestine Mount on Wed Aug 15, 2013 12:04 PM ------      Message from: Cleora Fleet      Created: Wed Aug 15, 2013 11:45 AM      Regarding: Lab results?       Please call lab and find out what happened to labs drawn on 10/2.   No results have been reported.   ------

## 2013-08-15 NOTE — Telephone Encounter (Signed)
[facility] stated they never received the specimen

## 2013-10-19 ENCOUNTER — Telehealth: Payer: Self-pay | Admitting: Internal Medicine

## 2013-12-11 ENCOUNTER — Ambulatory Visit: Payer: No Typology Code available for payment source | Attending: Internal Medicine

## 2013-12-11 VITALS — BP 148/86 | HR 82 | Temp 97.9°F | Resp 16 | Ht 62.0 in | Wt 112.0 lb

## 2013-12-11 DIAGNOSIS — Z0000 Encounter for general adult medical examination without abnormal findings: Secondary | ICD-10-CM

## 2013-12-11 NOTE — Progress Notes (Unsigned)
Pt is here to get a tb inj.

## 2013-12-11 NOTE — Patient Instructions (Signed)
Pt is to return on Thursday to read the tb results.

## 2013-12-13 ENCOUNTER — Telehealth: Payer: Self-pay | Admitting: Internal Medicine

## 2013-12-13 ENCOUNTER — Ambulatory Visit: Payer: No Typology Code available for payment source | Attending: Internal Medicine

## 2013-12-13 LAB — TB SKIN TEST
Induration: 0 mm
TB Skin Test: NEGATIVE

## 2013-12-13 NOTE — Telephone Encounter (Signed)
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 Pt was here for a TB test read today, 12/13/13. Please f/u with pt when ready.

## 2013-12-13 NOTE — Telephone Encounter (Signed)
Pt was given PPD results with documentation

## 2013-12-18 ENCOUNTER — Ambulatory Visit: Payer: No Typology Code available for payment source | Attending: Internal Medicine

## 2013-12-20 ENCOUNTER — Ambulatory Visit: Payer: Self-pay

## 2014-01-16 ENCOUNTER — Other Ambulatory Visit: Payer: Self-pay | Admitting: Family Medicine

## 2014-01-23 ENCOUNTER — Other Ambulatory Visit: Payer: Self-pay | Admitting: Pharmacist

## 2014-01-23 ENCOUNTER — Other Ambulatory Visit: Payer: Self-pay | Admitting: Family Medicine

## 2014-01-23 DIAGNOSIS — E785 Hyperlipidemia, unspecified: Secondary | ICD-10-CM

## 2014-01-23 MED ORDER — SIMVASTATIN 20 MG PO TABS
20.0000 mg | ORAL_TABLET | Freq: Every day | ORAL | Status: DC
Start: 2014-01-23 — End: 2014-12-18

## 2014-02-04 ENCOUNTER — Telehealth: Payer: Self-pay | Admitting: Internal Medicine

## 2014-02-04 ENCOUNTER — Other Ambulatory Visit: Payer: Self-pay | Admitting: Family Medicine

## 2014-02-04 DIAGNOSIS — Z1231 Encounter for screening mammogram for malignant neoplasm of breast: Secondary | ICD-10-CM

## 2014-02-04 NOTE — Telephone Encounter (Signed)
Pt would like clarification as whether she needs to come in for Dr appt every time she needs refill or if she is able to receive refill as long as nothing has changed and she is feeling well.  Please f/u with pt to explain.

## 2014-02-06 ENCOUNTER — Telehealth: Payer: Self-pay | Admitting: Emergency Medicine

## 2014-02-06 NOTE — Telephone Encounter (Signed)
Left message for pt to call clinic when message received 

## 2014-02-20 ENCOUNTER — Other Ambulatory Visit: Payer: Self-pay | Admitting: Family Medicine

## 2014-02-22 ENCOUNTER — Other Ambulatory Visit: Payer: Self-pay | Admitting: Family Medicine

## 2014-03-01 ENCOUNTER — Ambulatory Visit (HOSPITAL_COMMUNITY)
Admission: RE | Admit: 2014-03-01 | Discharge: 2014-03-01 | Disposition: A | Discharge status: Home / Self Care | Payer: No Typology Code available for payment source | Source: Ambulatory Visit | Attending: Family Medicine | Admitting: Family Medicine

## 2014-03-01 DIAGNOSIS — Z1231 Encounter for screening mammogram for malignant neoplasm of breast: Secondary | ICD-10-CM | POA: Insufficient documentation

## 2014-03-19 ENCOUNTER — Encounter: Payer: Self-pay | Admitting: Internal Medicine

## 2014-03-19 ENCOUNTER — Ambulatory Visit: Payer: No Typology Code available for payment source | Attending: Internal Medicine | Admitting: Internal Medicine

## 2014-03-19 VITALS — BP 170/90 | HR 85 | Temp 98.9°F | Resp 16 | Wt 115.8 lb

## 2014-03-19 DIAGNOSIS — I10 Essential (primary) hypertension: Secondary | ICD-10-CM

## 2014-03-19 DIAGNOSIS — Z79899 Other long term (current) drug therapy: Secondary | ICD-10-CM | POA: Insufficient documentation

## 2014-03-19 DIAGNOSIS — Z8673 Personal history of transient ischemic attack (TIA), and cerebral infarction without residual deficits: Secondary | ICD-10-CM | POA: Insufficient documentation

## 2014-03-19 DIAGNOSIS — E785 Hyperlipidemia, unspecified: Secondary | ICD-10-CM

## 2014-03-19 DIAGNOSIS — Z139 Encounter for screening, unspecified: Secondary | ICD-10-CM

## 2014-03-19 LAB — CBC WITH DIFFERENTIAL/PLATELET
Basophils Absolute: 0 10*3/uL (ref 0.0–0.1)
Basophils Relative: 0 % (ref 0–1)
EOS PCT: 1 % (ref 0–5)
Eosinophils Absolute: 0.1 10*3/uL (ref 0.0–0.7)
HEMATOCRIT: 34.3 % — AB (ref 36.0–46.0)
HEMOGLOBIN: 11.5 g/dL — AB (ref 12.0–15.0)
LYMPHS ABS: 2.9 10*3/uL (ref 0.7–4.0)
LYMPHS PCT: 57 % — AB (ref 12–46)
MCH: 28.6 pg (ref 26.0–34.0)
MCHC: 33.5 g/dL (ref 30.0–36.0)
MCV: 85.3 fL (ref 78.0–100.0)
MONO ABS: 0.5 10*3/uL (ref 0.1–1.0)
Monocytes Relative: 9 % (ref 3–12)
Neutro Abs: 1.7 10*3/uL (ref 1.7–7.7)
Neutrophils Relative %: 33 % — ABNORMAL LOW (ref 43–77)
PLATELETS: 294 10*3/uL (ref 150–400)
RBC: 4.02 MIL/uL (ref 3.87–5.11)
RDW: 13.1 % (ref 11.5–15.5)
WBC: 5.1 10*3/uL (ref 4.0–10.5)

## 2014-03-19 LAB — COMPLETE METABOLIC PANEL WITH GFR
ALT: 13 U/L (ref 0–35)
AST: 21 U/L (ref 0–37)
Albumin: 3.8 g/dL (ref 3.5–5.2)
Alkaline Phosphatase: 90 U/L (ref 39–117)
BUN: 19 mg/dL (ref 6–23)
CALCIUM: 9.7 mg/dL (ref 8.4–10.5)
CHLORIDE: 100 meq/L (ref 96–112)
CO2: 31 meq/L (ref 19–32)
CREATININE: 0.98 mg/dL (ref 0.50–1.10)
GFR, EST AFRICAN AMERICAN: 72 mL/min
GFR, Est Non African American: 62 mL/min
Glucose, Bld: 84 mg/dL (ref 70–99)
Potassium: 3.9 mEq/L (ref 3.5–5.3)
Sodium: 137 mEq/L (ref 135–145)
Total Bilirubin: 0.3 mg/dL (ref 0.2–1.2)
Total Protein: 7.3 g/dL (ref 6.0–8.3)

## 2014-03-19 LAB — LIPID PANEL
CHOLESTEROL: 171 mg/dL (ref 0–200)
HDL: 60 mg/dL (ref >39)
LDL CALC: 87 mg/dL (ref 0–99)
Total CHOL/HDL Ratio: 2.9 Ratio
Triglycerides: 121 mg/dL (ref <150)
VLDL: 24 mg/dL (ref 0–40)

## 2014-03-19 MED ORDER — SIMVASTATIN 20 MG PO TABS: ORAL_TABLET | ORAL | Status: DC

## 2014-03-19 MED ORDER — CLONIDINE HCL 0.1 MG PO TABS
0.2000 mg | ORAL_TABLET | Freq: Once | ORAL | Status: AC
  Administered 2014-03-19: 0.2 mg via ORAL

## 2014-03-19 MED ORDER — LISINOPRIL-HYDROCHLOROTHIAZIDE 20-12.5 MG PO TABS: ORAL_TABLET | ORAL | Status: DC

## 2014-03-19 NOTE — Patient Instructions (Addendum)
DASH Diet  The DASH diet stands for "Dietary Approaches to Stop Hypertension." It is a healthy eating plan that has been shown to reduce high blood pressure (hypertension) in as little as 14 days, while also possibly providing other significant health benefits. These other health benefits include reducing the risk of breast cancer after menopause and reducing the risk of type 2 diabetes, heart disease, colon cancer, and stroke. Health benefits also include weight loss and slowing kidney failure in patients with chronic kidney disease.   DIET GUIDELINES  · Limit salt (sodium). Your diet should contain less than 1500 mg of sodium daily.  · Limit refined or processed carbohydrates. Your diet should include mostly whole grains. Desserts and added sugars should be used sparingly.  · Include small amounts of heart-healthy fats. These types of fats include nuts, oils, and tub margarine. Limit saturated and trans fats. These fats have been shown to be harmful in the body.  CHOOSING FOODS   The following food groups are based on a 2000 calorie diet. See your Registered Dietitian for individual calorie needs.  Grains and Grain Products (6 to 8 servings daily)  · Eat More Often: Whole-wheat bread, brown rice, whole-grain or wheat pasta, quinoa, popcorn without added fat or salt (air popped).  · Eat Less Often: White bread, white pasta, white rice, cornbread.  Vegetables (4 to 5 servings daily)  · Eat More Often: Fresh, frozen, and canned vegetables. Vegetables may be raw, steamed, roasted, or grilled with a minimal amount of fat.  · Eat Less Often/Avoid: Creamed or fried vegetables. Vegetables in a cheese sauce.  Fruit (4 to 5 servings daily)  · Eat More Often: All fresh, canned (in natural juice), or frozen fruits. Dried fruits without added sugar. One hundred percent fruit juice (½ cup [237 mL] daily).  · Eat Less Often: Dried fruits with added sugar. Canned fruit in light or heavy syrup.  Lean Meats, Fish, and Poultry (2  servings or less daily. One serving is 3 to 4 oz [85-114 g]).  · Eat More Often: Ninety percent or leaner ground beef, tenderloin, sirloin. Round cuts of beef, chicken breast, turkey breast. All fish. Grill, bake, or broil your meat. Nothing should be fried.  · Eat Less Often/Avoid: Fatty cuts of meat, turkey, or chicken leg, thigh, or wing. Fried cuts of meat or fish.  Dairy (2 to 3 servings)  · Eat More Often: Low-fat or fat-free milk, low-fat plain or light yogurt, reduced-fat or part-skim cheese.  · Eat Less Often/Avoid: Milk (whole, 2%). Whole milk yogurt. Full-fat cheeses.  Nuts, Seeds, and Legumes (4 to 5 servings per week)  · Eat More Often: All without added salt.  · Eat Less Often/Avoid: Salted nuts and seeds, canned beans with added salt.  Fats and Sweets (limited)  · Eat More Often: Vegetable oils, tub margarines without trans fats, sugar-free gelatin. Mayonnaise and salad dressings.  · Eat Less Often/Avoid: Coconut oils, palm oils, butter, stick margarine, cream, half and half, cookies, candy, pie.  FOR MORE INFORMATION  The Dash Diet Eating Plan: www.dashdiet.org  Document Released: 10/14/2011 Document Revised: 01/17/2012 Document Reviewed: 10/14/2011  ExitCare® Patient Information ©2014 [employer].  Fat and Cholesterol Control Diet  Fat and cholesterol levels in your blood and organs are influenced by your diet. High levels of fat and cholesterol may lead to diseases of the heart, small and large blood vessels, gallbladder, liver, and pancreas.  CONTROLLING FAT AND CHOLESTEROL WITH DIET  Although exercise and lifestyle factors   are important, your diet is key. That is because certain foods are known to raise cholesterol and others to lower it. The goal is to balance foods for their effect on cholesterol and more importantly, to replace saturated and trans fat with other types of fat, such as monounsaturated fat, polyunsaturated fat, and omega-3 fatty acids.  On average, a person should consume no  more than 15 to 17 g of saturated fat daily. Saturated and trans fats are considered "bad" fats, and they will raise LDL cholesterol. Saturated fats are primarily found in animal products such as meats, butter, and cream. However, that does not mean you need to give up all your favorite foods. Today, there are good tasting, low-fat, low-cholesterol substitutes for most of the things you like to eat. Choose low-fat or nonfat alternatives. Choose round or loin cuts of red meat. These types of cuts are lowest in fat and cholesterol. Chicken (without the skin), fish, veal, and ground turkey breast are great choices. Eliminate fatty meats, such as hot dogs and salami. Even shellfish have little or no saturated fat. Have a 3 oz (85 g) portion when you eat lean meat, poultry, or fish.  Trans fats are also called "partially hydrogenated oils." They are oils that have been scientifically manipulated so that they are solid at room temperature resulting in a longer shelf life and improved taste and texture of foods in which they are added. Trans fats are found in stick margarine, some tub margarines, cookies, crackers, and baked goods.   When baking and cooking, oils are a great substitute for butter. The monounsaturated oils are especially beneficial since it is believed they lower LDL and raise HDL. The oils you should avoid entirely are saturated tropical oils, such as coconut and palm.   Remember to eat a lot from food groups that are naturally free of saturated and trans fat, including fish, fruit, vegetables, beans, grains (barley, rice, couscous, bulgur wheat), and pasta (without cream sauces).   IDENTIFYING FOODS THAT LOWER FAT AND CHOLESTEROL   Soluble fiber may lower your cholesterol. This type of fiber is found in fruits such as apples, vegetables such as broccoli, potatoes, and carrots, legumes such as beans, peas, and lentils, and grains such as barley. Foods fortified with plant sterols (phytosterol) may also  lower cholesterol. You should eat at least 2 g per day of these foods for a cholesterol lowering effect.   Read package labels to identify low-saturated fats, trans fat free, and low-fat foods at the supermarket. Select cheeses that have only 2 to 3 g saturated fat per ounce. Use a heart-healthy tub margarine that is free of trans fats or partially hydrogenated oil. When buying baked goods (cookies, crackers), avoid partially hydrogenated oils. Breads and muffins should be made from whole grains (whole-wheat or whole oat flour, instead of "flour" or "enriched flour"). Buy non-creamy canned soups with reduced salt and no added fats.   FOOD PREPARATION TECHNIQUES   Never deep-fry. If you must fry, either stir-fry, which uses very little fat, or use non-stick cooking sprays. When possible, broil, bake, or roast meats, and steam vegetables. Instead of putting butter or margarine on vegetables, use lemon and herbs, applesauce, and cinnamon (for squash and sweet potatoes). Use nonfat yogurt, salsa, and low-fat dressings for salads.   LOW-SATURATED FAT / LOW-FAT FOOD SUBSTITUTES  Meats / Saturated Fat (g)  · Avoid: Steak, marbled (3 oz/85 g) / 11 g  · Choose: Steak, lean (3 oz/85 g) / 4   g  · Avoid: Hamburger (3 oz/85 g) / 7 g  · Choose: Hamburger, lean (3 oz/85 g) / 5 g  · Avoid: Ham (3 oz/85 g) / 6 g  · Choose: Ham, lean cut (3 oz/85 g) / 2.4 g  · Avoid: Chicken, with skin, dark meat (3 oz/85 g) / 4 g  · Choose: Chicken, skin removed, dark meat (3 oz/85 g) / 2 g  · Avoid: Chicken, with skin, light meat (3 oz/85 g) / 2.5 g  · Choose: Chicken, skin removed, light meat (3 oz/85 g) / 1 g  Dairy / Saturated Fat (g)  · Avoid: Whole milk (1 cup) / 5 g  · Choose: Low-fat milk, 2% (1 cup) / 3 g  · Choose: Low-fat milk, 1% (1 cup) / 1.5 g  · Choose: Skim milk (1 cup) / 0.3 g  · Avoid: Hard cheese (1 oz/28 g) / 6 g  · Choose: Skim milk cheese (1 oz/28 g) / 2 to 3 g  · Avoid: Cottage cheese, 4% fat (1 cup) / 6.5 g  · Choose: Low-fat  cottage cheese, 1% fat (1 cup) / 1.5 g  · Avoid: Ice cream (1 cup) / 9 g  · Choose: Sherbet (1 cup) / 2.5 g  · Choose: Nonfat frozen yogurt (1 cup) / 0.3 g  · Choose: Frozen fruit bar / trace  · Avoid: Whipped cream (1 tbs) / 3.5 g  · Choose: Nondairy whipped topping (1 tbs) / 1 g  Condiments / Saturated Fat (g)  · Avoid: Mayonnaise (1 tbs) / 2 g  · Choose: Low-fat mayonnaise (1 tbs) / 1 g  · Avoid: Butter (1 tbs) / 7 g  · Choose: Extra light margarine (1 tbs) / 1 g  · Avoid: Coconut oil (1 tbs) / 11.8 g  · Choose: Olive oil (1 tbs) / 1.8 g  · Choose: Corn oil (1 tbs) / 1.7 g  · Choose: Safflower oil (1 tbs) / 1.2 g  · Choose: Sunflower oil (1 tbs) / 1.4 g  · Choose: Soybean oil (1 tbs) / 2.4 g  · Choose: Canola oil (1 tbs) / 1 g  Document Released: 10/25/2005 Document Revised: 02/19/2013 Document Reviewed: 04/15/2011  [employer]® Patient Information ©2014 [employer].

## 2014-03-19 NOTE — Progress Notes (Signed)
Patient here for follow up on her  Hypertension and cholesterol

## 2014-03-19 NOTE — Progress Notes (Signed)
MRN: [id number] Name: Angela [name]  Sex: female Age: 63 y.o. DOB: May 24, 1951  Allergies: Review of patient's allergies indicates no known allergies.  Chief Complaint  Patient presents with  . Medication Refill    HPI: Patient is 63 y.o. female who has to of hypertension hyperlipidemia, history of TIA, patient comes today for followup as per patient for the last 2 weeks she ran out of her blood pressure medication today her blood pressure is elevated denies any headache dizziness chest and shortness of breath, she was given clonidine and her repeat manual blood pressure is 170/90, as per patient she had a colonoscopy done several years ago, she is up-to-date with her mammogram. Patient has not had any recent blood work done.  Past Medical History  Diagnosis Date  . TIA (transient ischemic attack) 2004  . Hypertension   . Hyperlipidemia     History reviewed. No pertinent past surgical history.    Medication List       This list is accurate as of: 03/19/14  5:29 PM.  Always use your most recent med list.               aspirin 325 MG tablet  Take 325 mg by mouth daily.     calcium carbonate 600 MG Tabs tablet  Commonly known as:  OS-CAL  Take 600 mg by mouth 2 (two) times daily with a meal.     fluticasone 50 MCG/ACT nasal spray  Commonly known as:  FLONASE  Place 2 sprays into the nose daily.     lisinopril-hydrochlorothiazide 20-12.5 MG per tablet  Commonly known as:  PRINZIDE,ZESTORETIC  TAKE 1 TABLET BY MOUTH DAILY.     multivitamin tablet  Take 1 tablet by mouth daily.     simvastatin 20 MG tablet  Commonly known as:  ZOCOR  Take 1 tablet (20 mg total) by mouth at bedtime.     simvastatin 20 MG tablet  Commonly known as:  ZOCOR  TAKE 1 TABLET BY MOUTH AT BEDTIME.        Meds ordered this encounter  Medications  . lisinopril-hydrochlorothiazide (PRINZIDE,ZESTORETIC) 20-12.5 MG per tablet    Sig: TAKE 1 TABLET BY MOUTH DAILY.    Dispense:  30  tablet    Refill:  4  . simvastatin (ZOCOR) 20 MG tablet    Sig: TAKE 1 TABLET BY MOUTH AT BEDTIME.    Dispense:  30 tablet    Refill:  3  . cloNIDine (CATAPRES) tablet 0.2 mg    Sig:     Per Dr Orpah CobbAdvani    Immunization History  Administered Date(s) Administered  . PPD Test 12/11/2013    Family History  Problem Relation Age of Onset  . Cancer Mother 376    gallbladder  . Hypertension Mother   . Heart disease Father   . Hypertension Father   . Hypertension Brother     History  Substance Use Topics  . Smoking status: Never Smoker   . Smokeless tobacco: Not on file  . Alcohol Use: No    Review of Systems  Respiratory: Negative for cough, chest tightness and shortness of breath.   Cardiovascular: Negative for chest pain.  Neurological: Negative for tremors, weakness, light-headedness and headaches.     As noted in HPI  Filed Vitals:   03/19/14 1724  BP: 170/90  Pulse:   Temp:   Resp:     Physical Exam  Physical Exam  Constitutional: No distress.  Eyes: EOM are  normal. Pupils are equal, round, and reactive to light.  Cardiovascular: Normal rate and regular rhythm.   Pulmonary/Chest: Breath sounds normal. No respiratory distress. She has no wheezes. She has no rales.  Musculoskeletal: She exhibits no edema.    CBC    Component Value Date/Time   WBC 4.2 02/16/2013 1057   RBC 4.07 02/16/2013 1057   HGB 11.6* 02/16/2013 1057   HCT 34.6* 02/16/2013 1057   PLT 260 02/16/2013 1057   MCV 85.0 02/16/2013 1057   LYMPHSABS 2.0 12/26/2009 2027   MONOABS 0.5 12/26/2009 2027   EOSABS 0.1 12/26/2009 2027   BASOSABS 0.0 12/26/2009 2027    CMP     Component Value Date/Time   NA 140 02/16/2013 1057   K 4.0 02/16/2013 1057   CL 103 02/16/2013 1057   CO2 29 02/16/2013 1057   GLUCOSE 90 02/16/2013 1057   BUN 19 02/16/2013 1057   CREATININE 0.91 02/16/2013 1057   CALCIUM 9.7 02/16/2013 1057   PROT 7.9 02/16/2013 1057   ALBUMIN 3.6 02/16/2013 1057   AST 27 02/16/2013 1057   ALT  19 02/16/2013 1057   ALKPHOS 89 02/16/2013 1057   BILITOT 0.2* 02/16/2013 1057   GFRNONAA 67* 02/16/2013 1057   GFRAA 78* 02/16/2013 1057    Lab Results  Component Value Date/Time   CHOL 156 02/16/2013 10:57 AM    No components found with this basename: hga1c    Lab Results  Component Value Date/Time   AST 27 02/16/2013 10:57 AM    Assessment and Plan  Essential hypertension - Plan: cloNIDine (CATAPRES) tablet 0.2 mg, patient is given refill on her lisinopril/hydrochlorothiazide, advised for DASH diet, will repeat blood chemistry COMPLETE METABOLIC PANEL WITH GFR  Dyslipidemia - Plan: Continue with Zocor 20 mg, will repeat Lipid panel  Screening - Plan: Baseline blood work CBC with Differential, COMPLETE METABOLIC PANEL WITH GFR, TSH, Lipid panel, Vit D  25 hydroxy (rtn osteoporosis monitoring)   Health Maintenance  -Mammogram: Up-to-date    Return for hypertension, hyperipidemia, BP check in 2 weeks/Nurse Visit.  Doris Cheadleeepak Chemika Nightengale, MD

## 2014-03-20 LAB — TSH: TSH: 2.628 u[IU]/mL (ref 0.350–4.500)

## 2014-03-20 LAB — VITAMIN D 25 HYDROXY (VIT D DEFICIENCY, FRACTURES): Vit D, 25-Hydroxy: 41 ng/mL (ref 30–89)

## 2014-04-03 ENCOUNTER — Ambulatory Visit: Payer: No Typology Code available for payment source | Attending: Internal Medicine | Admitting: *Deleted

## 2014-04-03 VITALS — BP 132/74 | HR 89 | Resp 16

## 2014-04-03 DIAGNOSIS — I10 Essential (primary) hypertension: Secondary | ICD-10-CM

## 2014-04-03 NOTE — Progress Notes (Signed)
Patient here today for BP check

## 2014-09-16 ENCOUNTER — Ambulatory Visit: Payer: No Typology Code available for payment source | Attending: Internal Medicine

## 2014-12-18 ENCOUNTER — Encounter: Payer: Self-pay | Admitting: Family Medicine

## 2014-12-18 ENCOUNTER — Other Ambulatory Visit: Payer: Self-pay | Admitting: Family Medicine

## 2014-12-18 ENCOUNTER — Ambulatory Visit: Payer: Self-pay | Attending: Family Medicine | Admitting: Family Medicine

## 2014-12-18 VITALS — BP 133/80 | HR 47 | Temp 97.8°F | Resp 16 | Ht 62.0 in | Wt 111.0 lb

## 2014-12-18 DIAGNOSIS — I10 Essential (primary) hypertension: Secondary | ICD-10-CM

## 2014-12-18 DIAGNOSIS — Z124 Encounter for screening for malignant neoplasm of cervix: Secondary | ICD-10-CM | POA: Insufficient documentation

## 2014-12-18 DIAGNOSIS — E785 Hyperlipidemia, unspecified: Secondary | ICD-10-CM

## 2014-12-18 DIAGNOSIS — Z1231 Encounter for screening mammogram for malignant neoplasm of breast: Secondary | ICD-10-CM

## 2014-12-18 MED ORDER — SIMVASTATIN 20 MG PO TABS: 20.0000 mg | ORAL_TABLET | Freq: Every day | ORAL | Status: DC

## 2014-12-18 MED ORDER — LISINOPRIL-HYDROCHLOROTHIAZIDE 20-12.5 MG PO TABS: ORAL_TABLET | ORAL | Status: DC

## 2014-12-18 NOTE — Patient Instructions (Addendum)
Mrs. Blase MessShipman,  Your pelvic exam is normal. You will be called with pap smear results.   The recommendation for women over 30 is to have a pap smear with HPV testing  every 5 years until age 63. If you pap is normal today this can be your last pap smear.    Regarding mammograms, the letter works at both [facility] and at the breast imaging center on [address]. Please call to make your mamogram appointment at your earliest convenience.  For [facility], call us at [phone].  Regarding hypertension, your BP is well controlled today so I refilled the medication with a 3 month supply   F/u in 2-3 day prior to PCP appt for blood work  F/u with Dr. Orpah CobbAdvani for HTN management in 3 months, yearly HTN visit.  Dr. Armen PickupFunches

## 2014-12-18 NOTE — Progress Notes (Signed)
Patient is here for annual exam and pap No problems with urination Med refills needed Patient refuses

## 2014-12-18 NOTE — Assessment & Plan Note (Signed)
Regarding hypertension, your BP is well controlled today so I refilled the medication with a 3 month supply  Futures pre-visit labs ordered-CBC, CMP, lipids

## 2014-12-18 NOTE — Progress Notes (Signed)
   Subjective:    Patient ID: Angela [name], female    DOB: [birth year], 64 y.o.   MRN: [id number] CC: pap smear  PCP: Dr. Orpah CobbAdvani  HPI 64 yo F with hx of HTN and HLD presents for pap smear.   1. Cervical cancer screening: no hx of abnormal pap smears. Sexually active with husband sometimes. No vaginal discharge, itching or irritation.   2. Mammograms: due for mammogram. [facility]. No breast pain or mass.   Soc Hx: non smoker  Review of Systems As per HPI     Objective:   Physical Exam BP 133/80 mmHg  Pulse 47  Temp(Src) 97.8 F (36.6 C)  Resp 16  Ht 5\' 2"  (1.575 m)  Wt 111 lb (50.349 kg)  BMI 20.30 kg/m2  SpO2 96% General appearance: alert, cooperative and no distress Breasts: normal appearance, no masses or tenderness, No nipple retraction or dimpling, No nipple discharge or bleeding, No axillary or supraclavicular adenopathy, Normal to palpation without dominant masses Abdomen: soft, non-tender; bowel sounds normal; no masses,  no organomegaly Pelvic: cervix normal in appearance, external genitalia normal, no adnexal masses or tenderness, no cervical motion tenderness, rectovaginal septum normal, uterus normal size, shape, and consistency and vagina normal without discharge       Assessment & Plan:

## 2014-12-18 NOTE — Assessment & Plan Note (Signed)
Screening pap done today  

## 2014-12-19 LAB — CERVICOVAGINAL ANCILLARY ONLY
Chlamydia: NEGATIVE
NEISSERIA GONORRHEA: NEGATIVE
Wet Prep (BD Affirm): NEGATIVE
Wet Prep (BD Affirm): NEGATIVE
Wet Prep (BD Affirm): NEGATIVE

## 2014-12-20 ENCOUNTER — Telehealth: Payer: Self-pay | Admitting: *Deleted

## 2014-12-20 LAB — CYTOLOGY - PAP

## 2014-12-20 NOTE — Telephone Encounter (Signed)
-----   Message from Lora PaulaJosalyn C Funches, MD sent at 12/19/2014  6:32 PM EST ----- Negative GC/chlam Negative wet prep

## 2014-12-20 NOTE — Telephone Encounter (Signed)
LCV Message with negative results If any question return call

## 2014-12-24 ENCOUNTER — Telehealth: Payer: Self-pay | Admitting: Internal Medicine

## 2014-12-24 ENCOUNTER — Telehealth: Payer: Self-pay | Admitting: *Deleted

## 2014-12-24 NOTE — Telephone Encounter (Signed)
-----   Message from Lora PaulaJosalyn C Funches, MD sent at 12/21/2014  1:01 PM EST ----- Negative pap smear. Recommended repeat at age 64, last pap smear.  Patient may decide to not have additional paps given 5 year is recommended interval for cotesting (Pap +HPV). She is HPV negative and low risk (not sexually active). This decision should be made with her PCP.  Johathon Overturf please call. Dr. Mady HaagensenAdvani FYI

## 2014-12-24 NOTE — Telephone Encounter (Signed)
Left voice message to return call 

## 2014-12-24 NOTE — Telephone Encounter (Signed)
Pt is returning nurse's phone call . Please f/u with pt  °

## 2015-03-03 ENCOUNTER — Ambulatory Visit (HOSPITAL_COMMUNITY)
Admission: RE | Admit: 2015-03-03 | Discharge: 2015-03-03 | Disposition: A | Discharge status: Home / Self Care | Payer: Self-pay | Source: Ambulatory Visit | Attending: Family Medicine | Admitting: Family Medicine

## 2015-03-03 DIAGNOSIS — Z1231 Encounter for screening mammogram for malignant neoplasm of breast: Secondary | ICD-10-CM | POA: Insufficient documentation

## 2015-03-18 ENCOUNTER — Encounter: Payer: Self-pay | Admitting: Internal Medicine

## 2015-03-18 ENCOUNTER — Ambulatory Visit: Payer: Self-pay | Attending: Internal Medicine | Admitting: Internal Medicine

## 2015-03-18 VITALS — BP 122/74 | HR 79 | Temp 98.0°F | Resp 16 | Wt 107.0 lb

## 2015-03-18 DIAGNOSIS — I10 Essential (primary) hypertension: Secondary | ICD-10-CM

## 2015-03-18 DIAGNOSIS — E785 Hyperlipidemia, unspecified: Secondary | ICD-10-CM

## 2015-03-18 MED ORDER — LISINOPRIL-HYDROCHLOROTHIAZIDE 20-12.5 MG PO TABS: ORAL_TABLET | ORAL | Status: DC

## 2015-03-18 MED ORDER — SIMVASTATIN 20 MG PO TABS: 20.0000 mg | ORAL_TABLET | Freq: Every day | ORAL | Status: DC

## 2015-03-18 NOTE — Progress Notes (Signed)
Patient here for follow up on her hypertension and cholesterol Patient also requesting refills on her medications

## 2015-03-18 NOTE — Progress Notes (Signed)
MRN: [id number] Name: Angela [name]  Sex: female Age: 64 y.o. DOB: 1951-08-31  Allergies: Review of patient's allergies indicates no known allergies.  Chief Complaint  Patient presents with  . Follow-up    HPI: Patient is 64 y.o. female who history of hypertension, hyperlipidemia comes today for follow up, she is requesting refill on her medications, she is compliant in taking her medications denies any headache dizziness chest and shortness of breath, she recently had a repeat mammogram done, she also had a colonoscopy done in 2007 which reported internal hemorrhoids, patient is due next year.  Past Medical History  Diagnosis Date  . TIA (transient ischemic attack) 2004  . Hypertension   . Hyperlipidemia     History reviewed. No pertinent past surgical history.    Medication List       This list is accurate as of: 03/18/15  2:55 PM.  Always use your most recent med list.               aspirin 325 MG tablet  Take 325 mg by mouth daily.     calcium carbonate 600 MG Tabs tablet  Commonly known as:  OS-CAL  Take 600 mg by mouth 2 (two) times daily with a meal.     lisinopril-hydrochlorothiazide 20-12.5 MG per tablet  Commonly known as:  PRINZIDE,ZESTORETIC  TAKE 1 TABLET BY MOUTH DAILY.     multivitamin tablet  Take 1 tablet by mouth daily.     simvastatin 20 MG tablet  Commonly known as:  ZOCOR  Take 1 tablet (20 mg total) by mouth at bedtime.        Meds ordered this encounter  Medications  . lisinopril-hydrochlorothiazide (PRINZIDE,ZESTORETIC) 20-12.5 MG per tablet    Sig: TAKE 1 TABLET BY MOUTH DAILY.    Dispense:  90 tablet    Refill:  1  . simvastatin (ZOCOR) 20 MG tablet    Sig: Take 1 tablet (20 mg total) by mouth at bedtime.    Dispense:  90 tablet    Refill:  1    Immunization History  Administered Date(s) Administered  . PPD Test 12/11/2013    Family History  Problem Relation Age of Onset  . Cancer Mother 7676    gallbladder  .  Hypertension Mother   . Heart disease Father   . Hypertension Father   . Hypertension Brother     History  Substance Use Topics  . Smoking status: Never Smoker   . Smokeless tobacco: Not on file  . Alcohol Use: No    Review of Systems  Respiratory: Negative for cough and shortness of breath.   Neurological: Negative for dizziness and headaches.     As noted in HPI  Filed Vitals:   03/18/15 1424  BP: 122/74  Pulse: 79  Temp: 98 F (36.7 C)  Resp: 16    Physical Exam  Physical Exam  Constitutional: No distress.  Eyes: EOM are normal. Pupils are equal, round, and reactive to light.  Cardiovascular: Normal rate and regular rhythm.   Pulmonary/Chest: Breath sounds normal. No respiratory distress. She has no wheezes. She has no rales.    CBC    Component Value Date/Time   WBC 5.1 03/19/2014 1729   RBC 4.02 03/19/2014 1729   HGB 11.5* 03/19/2014 1729   HCT 34.3* 03/19/2014 1729   PLT 294 03/19/2014 1729   MCV 85.3 03/19/2014 1729   LYMPHSABS 2.9 03/19/2014 1729   MONOABS 0.5 03/19/2014 1729   EOSABS  0.1 03/19/2014 1729   BASOSABS 0.0 03/19/2014 1729    CMP     Component Value Date/Time   NA 137 03/19/2014 1729   K 3.9 03/19/2014 1729   CL 100 03/19/2014 1729   CO2 31 03/19/2014 1729   GLUCOSE 84 03/19/2014 1729   BUN 19 03/19/2014 1729   CREATININE 0.98 03/19/2014 1729   CREATININE 0.91 02/16/2013 1057   CALCIUM 9.7 03/19/2014 1729   PROT 7.3 03/19/2014 1729   ALBUMIN 3.8 03/19/2014 1729   AST 21 03/19/2014 1729   ALT 13 03/19/2014 1729   ALKPHOS 90 03/19/2014 1729   BILITOT 0.3 03/19/2014 1729   GFRNONAA 62 03/19/2014 1729   GFRNONAA 67* 02/16/2013 1057   GFRAA 72 03/19/2014 1729   GFRAA 78* 02/16/2013 1057    Lab Results  Component Value Date/Time   CHOL 171 03/19/2014 05:29 PM    Lab Results  Component Value Date/Time   HGBA1C 5.9* 02/16/2013 10:57 AM    Lab Results  Component Value Date/Time   AST 21 03/19/2014 05:29 PM     Assessment and Plan  Essential hypertension - Plan:blood pressure is well controlled, continue with current meds  lisinopril-hydrochlorothiazide (PRINZIDE,ZESTORETIC) 20-12.5 MG per tablet, COMPLETE METABOLIC PANEL WITH GFR  Hyperlipidemia - Plan: currently patient is on simvastatin (ZOCOR) 20 MG tablet,we'll check fastingLipid panel   Health Maintenance -Colonoscopy:last done was 2007, she is due next year.  -Mammogram:up-to-date   Return in about 3 months (around 06/18/2015) for hypertension, hyperipidemia, fasting blood work in 1 week lab appt.   This note has been created with Education officer, environmentalDragon speech recognition software and smart phrase technology. Any transcriptional errors are unintentional.    Tanysha Quant, Ayesha RumpfEEPAK, MD  Equal the

## 2015-03-19 ENCOUNTER — Telehealth: Payer: Self-pay | Admitting: *Deleted

## 2015-03-19 NOTE — Telephone Encounter (Signed)
LVM to  Return call 

## 2015-03-19 NOTE — Telephone Encounter (Signed)
-----   Message from Dessa PhiJosalyn Funches, MD sent at 03/04/2015 11:55 AM EDT ----- Negative mammogram, repeat in one year

## 2015-03-24 ENCOUNTER — Ambulatory Visit: Payer: Self-pay | Attending: Internal Medicine

## 2015-03-24 DIAGNOSIS — I10 Essential (primary) hypertension: Secondary | ICD-10-CM

## 2015-03-24 DIAGNOSIS — E785 Hyperlipidemia, unspecified: Secondary | ICD-10-CM

## 2015-03-24 LAB — COMPLETE METABOLIC PANEL WITH GFR
ALT: 27 U/L (ref 0–35)
AST: 30 U/L (ref 0–37)
Albumin: 3.8 g/dL (ref 3.5–5.2)
Alkaline Phosphatase: 70 U/L (ref 39–117)
BILIRUBIN TOTAL: 0.3 mg/dL (ref 0.2–1.2)
BUN: 24 mg/dL — ABNORMAL HIGH (ref 6–23)
CALCIUM: 9 mg/dL (ref 8.4–10.5)
CO2: 25 meq/L (ref 19–32)
CREATININE: 0.92 mg/dL (ref 0.50–1.10)
Chloride: 104 mEq/L (ref 96–112)
GFR, Est African American: 77 mL/min
GFR, Est Non African American: 67 mL/min
GLUCOSE: 91 mg/dL (ref 70–99)
Potassium: 4.1 mEq/L (ref 3.5–5.3)
SODIUM: 142 meq/L (ref 135–145)
TOTAL PROTEIN: 7.3 g/dL (ref 6.0–8.3)

## 2015-03-24 LAB — LIPID PANEL
Cholesterol: 160 mg/dL (ref 0–200)
HDL: 49 mg/dL (ref >=46)
LDL Cholesterol: 97 mg/dL (ref 0–99)
Total CHOL/HDL Ratio: 3.3 Ratio
Triglycerides: 72 mg/dL (ref <150)
VLDL: 14 mg/dL (ref 0–40)

## 2015-03-24 NOTE — Telephone Encounter (Signed)
LVM to return call x2  

## 2015-03-25 ENCOUNTER — Other Ambulatory Visit: Payer: Self-pay

## 2015-03-25 ENCOUNTER — Telehealth: Payer: Self-pay | Admitting: Internal Medicine

## 2015-03-25 LAB — CBC
HCT: 35.2 % — ABNORMAL LOW (ref 36.0–46.0)
HEMOGLOBIN: 11.6 g/dL — AB (ref 12.0–15.0)
MCH: 28.4 pg (ref 26.0–34.0)
MCHC: 33 g/dL (ref 30.0–36.0)
MCV: 86.3 fL (ref 78.0–100.0)
MPV: 11.1 fL (ref 8.6–12.4)
Platelets: 199 10*3/uL (ref 150–400)
RBC: 4.08 MIL/uL (ref 3.87–5.11)
RDW: 13.3 % (ref 11.5–15.5)
WBC: 3.5 10*3/uL — AB (ref 4.0–10.5)

## 2015-03-25 NOTE — Telephone Encounter (Signed)
Patient is returning phone call from nurse, please f/u °

## 2015-03-26 ENCOUNTER — Telehealth: Payer: Self-pay

## 2015-03-26 NOTE — Telephone Encounter (Signed)
Patient is aware of her lab results 

## 2015-03-26 NOTE — Telephone Encounter (Signed)
-----   Message from Doris Cheadleeepak Advani, MD sent at 03/25/2015 12:49 PM EDT ----- Call and let the Patient know that blood work is normal.

## 2015-04-11 ENCOUNTER — Ambulatory Visit: Payer: Self-pay

## 2015-05-05 ENCOUNTER — Ambulatory Visit: Payer: Self-pay

## 2015-06-03 ENCOUNTER — Ambulatory Visit: Payer: Self-pay | Attending: Internal Medicine | Admitting: Internal Medicine

## 2015-06-03 ENCOUNTER — Encounter: Payer: Self-pay | Admitting: Internal Medicine

## 2015-06-03 VITALS — BP 121/78 | HR 91 | Temp 98.0°F | Resp 16 | Wt 103.8 lb

## 2015-06-03 DIAGNOSIS — D649 Anemia, unspecified: Secondary | ICD-10-CM | POA: Insufficient documentation

## 2015-06-03 DIAGNOSIS — Z7982 Long term (current) use of aspirin: Secondary | ICD-10-CM | POA: Insufficient documentation

## 2015-06-03 DIAGNOSIS — Z1211 Encounter for screening for malignant neoplasm of colon: Secondary | ICD-10-CM

## 2015-06-03 DIAGNOSIS — I10 Essential (primary) hypertension: Secondary | ICD-10-CM | POA: Insufficient documentation

## 2015-06-03 DIAGNOSIS — Z79899 Other long term (current) drug therapy: Secondary | ICD-10-CM | POA: Insufficient documentation

## 2015-06-03 DIAGNOSIS — R634 Abnormal weight loss: Secondary | ICD-10-CM | POA: Insufficient documentation

## 2015-06-03 DIAGNOSIS — E785 Hyperlipidemia, unspecified: Secondary | ICD-10-CM | POA: Insufficient documentation

## 2015-06-03 LAB — ANEMIA PANEL
%SAT: 15 % — ABNORMAL LOW (ref 20–55)
ABS Retic: 33.4 10*3/uL (ref 19.0–186.0)
Ferritin: 162 ng/mL (ref 10–291)
Folate: >20 ng/mL
Iron: 44 ug/dL (ref 42–145)
RBC.: 3.71 MIL/uL — ABNORMAL LOW (ref 3.87–5.11)
Retic Ct Pct: 0.9 % (ref 0.4–2.3)
TIBC: 285 ug/dL (ref 250–470)
UIBC: 241 ug/dL (ref 125–400)
VITAMIN B 12: 664 pg/mL (ref 211–911)

## 2015-06-03 LAB — CBC WITH DIFFERENTIAL/PLATELET
BASOS PCT: 0 % (ref 0–1)
Basophils Absolute: 0 10*3/uL (ref 0.0–0.1)
EOS PCT: 2 % (ref 0–5)
Eosinophils Absolute: 0.1 10*3/uL (ref 0.0–0.7)
HEMATOCRIT: 31.9 % — AB (ref 36.0–46.0)
HEMOGLOBIN: 10.8 g/dL — AB (ref 12.0–15.0)
LYMPHS ABS: 1.7 10*3/uL (ref 0.7–4.0)
Lymphocytes Relative: 51 % — ABNORMAL HIGH (ref 12–46)
MCH: 29.1 pg (ref 26.0–34.0)
MCHC: 33.9 g/dL (ref 30.0–36.0)
MCV: 86 fL (ref 78.0–100.0)
MPV: 10.1 fL (ref 8.6–12.4)
Monocytes Absolute: 0.4 10*3/uL (ref 0.1–1.0)
Monocytes Relative: 13 % — ABNORMAL HIGH (ref 3–12)
NEUTROS ABS: 1.2 10*3/uL — AB (ref 1.7–7.7)
NEUTROS PCT: 34 % — AB (ref 43–77)
Platelets: 235 10*3/uL (ref 150–400)
RBC: 3.71 MIL/uL — ABNORMAL LOW (ref 3.87–5.11)
RDW: 14.2 % (ref 11.5–15.5)
WBC: 3.4 10*3/uL — ABNORMAL LOW (ref 4.0–10.5)

## 2015-06-03 LAB — HEMOGLOBIN A1C
HEMOGLOBIN A1C: 6 % — AB (ref <5.7)
MEAN PLASMA GLUCOSE: 126 mg/dL — AB (ref <117)

## 2015-06-03 LAB — TSH: TSH: 2.575 u[IU]/mL (ref 0.350–4.500)

## 2015-06-03 NOTE — Progress Notes (Signed)
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

## 2015-06-03 NOTE — Progress Notes (Signed)
MRN: [id number] Name: Angela [name]  Sex: female Age: 64 y.o. DOB: 04-02-1951  Allergies: Review of patient's allergies indicates no known allergies.  Chief Complaint  Patient presents with  . Weight Loss    HPI: Patient is 64 y.o. female who Has history of hypertension hyperlipidemia, comes today complaining of losing weight, she is last 3-4 pounds compared to last visit, previous blood work reviewed also noticed she is borderline anemic, she had a colonoscopy done in 2007 which reported hemorrhoids, currently she denies any bleeding per rectum, denies any change in bowel habits denies any nausea vomiting, she also reports that she sweats a lot. Patient denies any chest pain or shortness of breath.  Past Medical History  Diagnosis Date  . TIA (transient ischemic attack) 2004  . Hypertension   . Hyperlipidemia     History reviewed. No pertinent past surgical history.    Medication List       This list is accurate as of: 06/03/15  2:57 PM.  Always use your most recent med list.               aspirin 325 MG tablet  Take 325 mg by mouth daily.     calcium carbonate 600 MG Tabs tablet  Commonly known as:  OS-CAL  Take 600 mg by mouth 2 (two) times daily with a meal.     lisinopril-hydrochlorothiazide 20-12.5 MG per tablet  Commonly known as:  PRINZIDE,ZESTORETIC  TAKE 1 TABLET BY MOUTH DAILY.     multivitamin tablet  Take 1 tablet by mouth daily.     simvastatin 20 MG tablet  Commonly known as:  ZOCOR  Take 1 tablet (20 mg total) by mouth at bedtime.        No orders of the defined types were placed in this encounter.    Immunization History  Administered Date(s) Administered  . PPD Test 12/11/2013    Family History  Problem Relation Age of Onset  . Cancer Mother 65    gallbladder  . Hypertension Mother   . Heart disease Father   . Hypertension Father   . Hypertension Brother     History  Substance Use Topics  . Smoking status: Never Smoker    . Smokeless tobacco: Not on file  . Alcohol Use: No    Review of Systems   As noted in HPI  Filed Vitals:   06/03/15 1420  BP: 121/78  Pulse: 91  Temp: 98 F (36.7 C)  Resp: 16    Physical Exam  Physical Exam  Constitutional: No distress.  Eyes: EOM are normal. Pupils are equal, round, and reactive to light.  Cardiovascular: Normal rate and regular rhythm.   Pulmonary/Chest: Breath sounds normal. No respiratory distress. She has no wheezes. She has no rales.  Musculoskeletal: She exhibits no edema.    Labs   Lab Results  Component Value Date   WBC 3.5* 03/24/2015   HGB 11.6* 03/24/2015   HCT 35.2* 03/24/2015   PLT 199 03/24/2015   GLUCOSE 91 03/24/2015   CHOL 160 03/24/2015   TRIG 72 03/24/2015   HDL 49 03/24/2015   LDLCALC 97 03/24/2015   ALT 27 03/24/2015   AST 30 03/24/2015   NA 142 03/24/2015   K 4.1 03/24/2015   CL 104 03/24/2015   CREATININE 0.92 03/24/2015   BUN 24* 03/24/2015   CO2 25 03/24/2015   TSH 2.628 03/19/2014   HGBA1C 5.9* 02/16/2013   MICROALBUR 0.69 08/01/2009  Lab Results  Component Value Date   HGBA1C 5.9* 02/16/2013     Assessment and Plan  Essential hypertension Blood pressure is well-controlled continued current meds  Anemia, unspecified anemia type - Plan: Anemia panel, CBC with Differential/Platelet, Ambulatory referral to Gastroenterology  Loss of weight - Plan: TSH, Ambulatory referral to Gastroenterology, Hemoglobin A1c  Special screening for malignant neoplasms, colon - Plan: Ambulatory referral to Gastroenterology   Health Maintenance -Colonoscopy: last colonoscopy done was 2007. -Pap Smear:up-to-date -Mammogram:up-to-date   Return in about 3 months (around 09/03/2015), or if symptoms worsen or fail to improve.   This note has been created with Education officer, environmental. Any transcriptional errors are unintentional.    Doris Cheadle, MD

## 2015-06-04 ENCOUNTER — Ambulatory Visit: Payer: Self-pay

## 2015-06-04 ENCOUNTER — Telehealth: Payer: Self-pay

## 2015-06-04 NOTE — Telephone Encounter (Signed)
Patient called returning nurse's call, please f/u °

## 2015-06-04 NOTE — Telephone Encounter (Signed)
-----   Message from Doris Cheadle, MD sent at 06/04/2015 11:11 AM EDT ----- Blood work reviewed noticed hemoglobin A1c of 6.0%, patient has prediabetes, call and advise patient for low carbohydrate diet. Patient has anemia, has low iron saturation, advise patient take over-the-counter iron supplement daily, patient has already been referred to GI for repeat colonoscopy. Also let the patient know that her TSH level is in normal range.

## 2015-06-04 NOTE — Telephone Encounter (Signed)
Patient not available Left message on voice mail to return our call 

## 2015-06-04 NOTE — Telephone Encounter (Signed)
Returned patient phone call Patient is aware of her lab results 

## 2015-06-05 ENCOUNTER — Encounter: Payer: Self-pay | Admitting: Physician Assistant

## 2015-06-13 ENCOUNTER — Encounter: Payer: Self-pay | Admitting: *Deleted

## 2015-06-13 ENCOUNTER — Ambulatory Visit (INDEPENDENT_AMBULATORY_CARE_PROVIDER_SITE_OTHER): Payer: Self-pay | Admitting: Gastroenterology

## 2015-06-13 VITALS — BP 130/60 | HR 76 | Ht 62.0 in | Wt 104.6 lb

## 2015-06-13 DIAGNOSIS — R634 Abnormal weight loss: Secondary | ICD-10-CM

## 2015-06-13 DIAGNOSIS — D649 Anemia, unspecified: Secondary | ICD-10-CM

## 2015-06-13 DIAGNOSIS — Z862 Personal history of diseases of the blood and blood-forming organs and certain disorders involving the immune mechanism: Secondary | ICD-10-CM | POA: Insufficient documentation

## 2015-06-13 MED ORDER — NA SULFATE-K SULFATE-MG SULF 17.5-3.13-1.6 GM/177ML PO SOLN: ORAL | Status: DC

## 2015-06-13 NOTE — Progress Notes (Signed)
06/13/2015 Angela [name] [id number] Jun 07, 1951   HISTORY OF PRESENT ILLNESS:  This is a 64 year old female who is new to our office.  She has been referred here by her PCP, dr. Orpah Cobb, for evaluation of iron deficiency anemia.  Her 2 months ago was 11.6 grams and it had been stable at that level for the past 2 years.  Ten days ago, Hgb was down to 10.8 grams.  MCV is normal at 86.0, but iron studies are borderline low.  She was placed on iron supplements by her PCP and sent here to discuss EGD and colonoscopy.  She had both of these procedures performed by Dr.Ganem at [facility] in 01/2006 at which time EGD was normal and colonoscopy was normal except for mild internal hemorrhoids.  She denies seeing any blood in her stools or dark stools.  Denies any GI complaints.  Her only complaint is a 10 pound weight loss recently despite consistency with her diet and activity level.     Past Medical History  Diagnosis Date  . TIA (transient ischemic attack) 2004  . Hypertension   . Hyperlipidemia   . Anemia    Past Surgical History  Procedure Laterality Date  . Neg hx      reports that she has never smoked. She has never used smokeless tobacco. She reports that she does not drink alcohol or use illicit drugs. family history includes Cancer (age of onset: 76) in her mother; Heart disease in her father; Hypertension in her brother, father, and mother. There is no history of Colon cancer. No Known Allergies    Outpatient Encounter Prescriptions as of 06/13/2015  Medication Sig  . aspirin 325 MG tablet Take 325 mg by mouth daily.  . calcium carbonate (OS-CAL) 600 MG TABS Take 600 mg by mouth 2 (two) times daily with a meal.  . Ferrous Sulfate (IRON) 325 (65 FE) MG TABS Take 1 tablet by mouth 3 (three) times daily.  Marland Kitchen lisinopril-hydrochlorothiazide (PRINZIDE,ZESTORETIC) 20-12.5 MG per tablet TAKE 1 TABLET BY MOUTH DAILY.  . Multiple Vitamin (MULTIVITAMIN) tablet Take 1 tablet by mouth daily.  .  simvastatin (ZOCOR) 20 MG tablet Take 1 tablet (20 mg total) by mouth at bedtime.   No facility-administered encounter medications on file as of 06/13/2015.     REVIEW OF SYSTEMS  : All other systems reviewed and negative except where noted in the History of Present Illness.   PHYSICAL EXAM: BP 130/60 mmHg  Pulse 76  Ht  (1.575 m)  Wt 104 lb 9.6 oz (47.446 kg)  BMI 19.13 kg/m2 General: Well developed female in no acute distress Head: Normocephalic and atraumatic Eyes:  Sclerae anicteric, conjunctiva pink. Ears: Normal auditory acuity.  Lungs: Clear throughout to auscultation Heart: Regular rate and rhythm Abdomen: Soft, non-distended. Normal bowel sounds.  Non-tender. Rectal:  Will be done at the time of colonoscopy. Musculoskeletal: Symmetrical with no gross deformities  Skin: No lesions on visible extremities Extremities: No edema  Neurological: Alert oriented x 4, grossly non-focal Psychological:  Alert and cooperative. Normal mood and affect  ASSESSMENT AND PLAN: -Weight loss:  10 pounds recently. -Anemia, normocytic but iron studies borderline low:  Hgb 10.8 grams, down from 11.6 grams two months ago.    *Last EGD and colonoscopy were 9 years ago.  Will repeat those studies at this time.  Schedule with Dr,. Marina Goodell to rule out any neoplasm, etc.  The risks, benefits, and alternatives to EGD and colonoscopy were discussed  with the patient and she consents to proceed.  *Continue iron supplements per PCP.  CC:  Doris Cheadle, MD

## 2015-06-13 NOTE — Progress Notes (Signed)
Agree with assessment and plans as outlined 

## 2015-06-13 NOTE — Patient Instructions (Signed)

## 2015-06-20 ENCOUNTER — Ambulatory Visit: Payer: Self-pay | Admitting: Physician Assistant

## 2015-08-14 ENCOUNTER — Ambulatory Visit (AMBULATORY_SURGERY_CENTER): Payer: Self-pay | Admitting: Internal Medicine

## 2015-08-14 ENCOUNTER — Encounter: Payer: Self-pay | Admitting: Internal Medicine

## 2015-08-14 VITALS — BP 118/71 | HR 79 | Temp 98.6°F | Resp 16 | Ht 62.0 in | Wt 104.0 lb

## 2015-08-14 DIAGNOSIS — D509 Iron deficiency anemia, unspecified: Secondary | ICD-10-CM

## 2015-08-14 DIAGNOSIS — R634 Abnormal weight loss: Secondary | ICD-10-CM

## 2015-08-14 DIAGNOSIS — D649 Anemia, unspecified: Secondary | ICD-10-CM

## 2015-08-14 MED ORDER — SODIUM CHLORIDE 0.9 % IV SOLN: 500.0000 mL | INTRAVENOUS | Status: DC

## 2015-08-14 NOTE — Op Note (Signed)
[facility] [address] [place], [address]   COLONOSCOPY PROCEDURE REPORT  PATIENT: Angela [name], Angela [name]  MR#: [id number] BIRTHDATE: Mar 07, 1951 , 63  yrs. old GENDER: female ENDOSCOPIST: Roxy Cedar, MD REFERRED QM:VHQION Advani, MD PROCEDURE DATE:  08/14/2015 PROCEDURE:   Colonoscopy, diagnostic First Screening Colonoscopy - Avg.  risk and is 50 yrs.  old or older - No.  Prior Negative Screening - Now for repeat screening. N/A  History of Adenoma - Now for follow-up colonoscopy & has been > or = to 3 yrs.  N/A  Polyps removed today? No Recommend repeat exam, <10 yrs? No ASA CLASS:   Class II INDICATIONS:Unexplained (? iron deficiency) anemia and Patient is not applicable for Colorectal Neoplasm Risk Assessment for this procedure. MEDICATIONS: Monitored anesthesia care and Propofol 300 mg IV  DESCRIPTION OF PROCEDURE:   After the risks benefits and alternatives of the procedure were thoroughly explained, informed consent was obtained.  The digital rectal exam revealed no abnormalities of the rectum.   The LB [id number]  endoscope was introduced through the anus and advanced to the cecum, which was identified by both the appendix and ileocecal valve. No adverse events experienced.   The quality of the prep was good.  (Suprep was used)  The instrument was then slowly withdrawn as the colon was fully examined. Estimated blood loss is zero unless otherwise noted in this procedure report.      COLON FINDINGS: A normal appearing cecum, ileocecal valve, and appendiceal orifice were identified.  The ascending, transverse, descending, sigmoid colon, and rectum appeared unremarkable. Retroflexed views revealed internal hemorrhoids. The time to cecum = 4.8 Withdrawal time = 13.3   The scope was withdrawn and the procedure completed. COMPLICATIONS: There were no immediate complications.  ENDOSCOPIC IMPRESSION: 1. Normal colonoscopy  RECOMMENDATIONS: 1.   Continue current colorectal screening recommendations for "routine risk" patients with a repeat colonoscopy in 10 years. 2.  Upper endoscopy today (please see report)  eSigned:  Roxy Cedar, MD 08/14/2015 3:20 PM   cc: The Patient    ; Doris Cheadle, MD

## 2015-08-14 NOTE — Patient Instructions (Signed)
Dr. Lamar Sprinkles office will call you with scheduled CT scan and instructions. You have been provided contrast to place in your refrigerator to take the day of your scan.  YOU HAD AN ENDOSCOPIC PROCEDURE TODAY AT THE [facility]:   Refer to the procedure report that was given to you for any specific questions about what was found during the examination.  If the procedure report does not answer your questions, please call your gastroenterologist to clarify.  If you requested that your care partner not be given the details of your procedure findings, then the procedure report has been included in a sealed envelope for you to review at your convenience later.  YOU SHOULD EXPECT: Some feelings of bloating in the abdomen. Passage of more gas than usual.  Walking can help get rid of the air that was put into your GI tract during the procedure and reduce the bloating. If you had a lower endoscopy (such as a colonoscopy or flexible sigmoidoscopy) you may notice spotting of blood in your stool or on the toilet paper. If you underwent a bowel prep for your procedure, you may not have a normal bowel movement for a few days.  Please Note:  You might notice some irritation and congestion in your nose or some drainage.  This is from the oxygen used during your procedure.  There is no need for concern and it should clear up in a day or so.  SYMPTOMS TO REPORT IMMEDIATELY:   Following lower endoscopy (colonoscopy or flexible sigmoidoscopy):  Excessive amounts of blood in the stool  Significant tenderness or worsening of abdominal pains  Swelling of the abdomen that is new, acute  Fever of 100F or higher   Following upper endoscopy (EGD)  Vomiting of blood or coffee ground material  New chest pain or pain under the shoulder blades  Painful or persistently difficult swallowing  New shortness of breath  Fever of 100F or higher  Black, tarry-looking stools  For urgent or emergent issues, a  gastroenterologist can be reached at any hour by calling [phone].   DIET: Your first meal following the procedure should be a small meal and then it is ok to progress to your normal diet. Heavy or fried foods are harder to digest and may make you feel nauseous or bloated.  Likewise, meals heavy in dairy and vegetables can increase bloating.  Drink plenty of fluids but you should avoid alcoholic beverages for 24 hours.  ACTIVITY:  You should plan to take it easy for the rest of today and you should NOT DRIVE or use heavy machinery until tomorrow (because of the sedation medicines used during the test).    FOLLOW UP: Our staff will call the number listed on your records the next business day following your procedure to check on you and address any questions or concerns that you may have regarding the information given to you following your procedure. If we do not reach you, we will leave a message.  However, if you are feeling well and you are not experiencing any problems, there is no need to return our call.  We will assume that you have returned to your regular daily activities without incident.  If any biopsies were taken you will be contacted by phone or by letter within the next 1-3 weeks.  Please call us at [phone] if you have not heard about the biopsies in 3 weeks.    SIGNATURES/CONFIDENTIALITY: You and/or your care partner  have signed paperwork which will be entered into your electronic medical record.  These signatures attest to the fact that that the information above on your After Visit Summary has been reviewed and is understood.  Full responsibility of the confidentiality of this discharge information lies with you and/or your care-partner.

## 2015-08-14 NOTE — Progress Notes (Signed)
Contrast given to son with CT instruction sheet. Office to call with appointment and instructions. Patient does not need bloodwork prior.

## 2015-08-14 NOTE — Progress Notes (Signed)
Transferred to recovery room. A/O x3, pleased with MAC.  VSS.  Report to Karen, RN. 

## 2015-08-14 NOTE — Op Note (Signed)
[facility] [address] [place], [address]   ENDOSCOPY PROCEDURE REPORT  PATIENT: Polette, Nofsinger  MR#: [id number] BIRTHDATE: 1951/01/27 , 63  yrs. old GENDER: female ENDOSCOPIST: Roxy Cedar, MD REFERRED BY:  Doris Cheadle, MD PROCEDURE DATE:  08/14/2015 PROCEDURE:  EGD, diagnostic ASA CLASS:     Class II INDICATIONS:  iron deficiency anemia.  ; weight loss?"unexplained MEDICATIONS: Monitored anesthesia care and Propofol 100 mg IV TOPICAL ANESTHETIC: none  DESCRIPTION OF PROCEDURE: After the risks benefits and alternatives of the procedure were thoroughly explained, informed consent was obtained.  The LB [id number] endoscope was introduced through the mouth and advanced to the second portion of the duodenum , Without limitations.  The instrument was slowly withdrawn as the mucosa was fully examined.      EXAM: The esophagus and gastroesophageal junction were completely normal in appearance.  The stomach was entered and closely examined.The antrum, angularis, and lesser curvature were well visualized, including a retroflexed view of the cardia and fundus. The stomach wall was normally distensable.  The scope passed easily through the pylorus into the duodenum.  Retroflexed views revealed no abnormalities.     The scope was then withdrawn from the patient and the procedure completed.  COMPLICATIONS: There were no immediate complications.  ENDOSCOPIC IMPRESSION: 1. Normal EGD 2. No explanation for normocytic anemia and weight loss  RECOMMENDATIONS: 1. Contrast-enhanced CT scan of the chest, abdomen, and pelvis "evaluate unexplained weight loss and anemia". My office will arrange. Will contact you after the results are available  REPEAT EXAM:  eSigned:  Roxy Cedar, MD 08/14/2015 3:26 PM    CC:The Patient  ; Doris Cheadle, MD

## 2015-08-15 ENCOUNTER — Telehealth: Payer: Self-pay | Admitting: *Deleted

## 2015-08-15 NOTE — Telephone Encounter (Signed)
  Follow up Call-  Call back number 08/14/2015  Post procedure Call Back phone  # [phone]  Permission to leave phone message Yes     Patient questions:  Do you have a fever, pain , or abdominal swelling? No. Pain Score  0 *  Have you tolerated food without any problems? Yes.    Have you been able to return to your normal activities? Yes.    Do you have any questions about your discharge instructions: Diet   No. Medications  No. Follow up visit  No.  Do you have questions or concerns about your Care? No.  Actions: * If pain score is 4 or above: No action needed, pain <4.

## 2015-10-06 ENCOUNTER — Other Ambulatory Visit: Payer: Self-pay | Admitting: Internal Medicine

## 2015-10-06 DIAGNOSIS — I10 Essential (primary) hypertension: Secondary | ICD-10-CM

## 2015-10-06 DIAGNOSIS — E785 Hyperlipidemia, unspecified: Secondary | ICD-10-CM

## 2015-10-06 NOTE — Telephone Encounter (Signed)
Nurse called patient, patient verified date of birth. Patient aware of prescription refills for simvastatin and lisinopril-hctz sent to pharmacy. Patient explains she is trying to get her insurance started before coming to appointment.  Patient explains insurance should be ready in January and will make appointment for January. Nurse sent refills with 2 month supply. Nurse transferred patient to front office staff to make appointment to be seen.

## 2015-11-11 ENCOUNTER — Other Ambulatory Visit: Payer: Self-pay | Admitting: Family Medicine

## 2015-11-11 MED FILL — SIMVASTATIN 20 MG TABLET: 20 | 30 days supply | Qty: 30 | Fill #1

## 2015-11-11 MED FILL — LISINOPRIL-HCTZ 20-12.5 MG: 20-12.5 | 30 days supply | Qty: 30 | Fill #1

## 2015-11-21 ENCOUNTER — Ambulatory Visit: Payer: BLUE CROSS/BLUE SHIELD | Attending: Family Medicine | Admitting: Family Medicine

## 2015-11-21 ENCOUNTER — Encounter: Payer: Self-pay | Admitting: Family Medicine

## 2015-11-21 VITALS — BP 117/71 | HR 80 | Temp 97.9°F | Resp 16 | Ht 62.0 in | Wt 110.0 lb

## 2015-11-21 DIAGNOSIS — Z7982 Long term (current) use of aspirin: Secondary | ICD-10-CM | POA: Insufficient documentation

## 2015-11-21 DIAGNOSIS — I10 Essential (primary) hypertension: Secondary | ICD-10-CM | POA: Diagnosis not present

## 2015-11-21 DIAGNOSIS — Z79899 Other long term (current) drug therapy: Secondary | ICD-10-CM | POA: Diagnosis not present

## 2015-11-21 DIAGNOSIS — Z1159 Encounter for screening for other viral diseases: Secondary | ICD-10-CM

## 2015-11-21 DIAGNOSIS — E785 Hyperlipidemia, unspecified: Secondary | ICD-10-CM

## 2015-11-21 DIAGNOSIS — Z8673 Personal history of transient ischemic attack (TIA), and cerebral infarction without residual deficits: Secondary | ICD-10-CM | POA: Insufficient documentation

## 2015-11-21 DIAGNOSIS — D649 Anemia, unspecified: Secondary | ICD-10-CM | POA: Diagnosis not present

## 2015-11-21 DIAGNOSIS — Z862 Personal history of diseases of the blood and blood-forming organs and certain disorders involving the immune mechanism: Secondary | ICD-10-CM

## 2015-11-21 DIAGNOSIS — Z0000 Encounter for general adult medical examination without abnormal findings: Secondary | ICD-10-CM

## 2015-11-21 DIAGNOSIS — D508 Other iron deficiency anemias: Secondary | ICD-10-CM

## 2015-11-21 LAB — CBC
HCT: 37.2 % (ref 36.0–46.0)
HEMOGLOBIN: 12.5 g/dL (ref 12.0–15.0)
MCH: 29.1 pg (ref 26.0–34.0)
MCHC: 33.6 g/dL (ref 30.0–36.0)
MCV: 86.5 fL (ref 78.0–100.0)
MPV: 10.5 fL (ref 8.6–12.4)
PLATELETS: 238 10*3/uL (ref 150–400)
RBC: 4.3 MIL/uL (ref 3.87–5.11)
RDW: 14.7 % (ref 11.5–15.5)
WBC: 4.7 10*3/uL (ref 4.0–10.5)

## 2015-11-21 LAB — POCT GLYCOSYLATED HEMOGLOBIN (HGB A1C): Hemoglobin A1C: 5.6

## 2015-11-21 LAB — COMPLETE METABOLIC PANEL WITH GFR
ALBUMIN: 4.1 g/dL (ref 3.6–5.1)
ALK PHOS: 77 U/L (ref 33–130)
ALT: 20 U/L (ref 6–29)
AST: 27 U/L (ref 10–35)
BILIRUBIN TOTAL: 0.3 mg/dL (ref 0.2–1.2)
BUN: 28 mg/dL — ABNORMAL HIGH (ref 7–25)
CO2: 30 mmol/L (ref 20–31)
CREATININE: 0.92 mg/dL (ref 0.50–0.99)
Calcium: 10.1 mg/dL (ref 8.6–10.4)
Chloride: 100 mmol/L (ref 98–110)
GFR, EST AFRICAN AMERICAN: 77 mL/min (ref >=60)
GFR, Est Non African American: 66 mL/min (ref >=60)
GLUCOSE: 90 mg/dL (ref 65–99)
Potassium: 3.9 mmol/L (ref 3.5–5.3)
SODIUM: 139 mmol/L (ref 135–146)
TOTAL PROTEIN: 7.3 g/dL (ref 6.1–8.1)

## 2015-11-21 LAB — LIPID PANEL
CHOLESTEROL: 175 mg/dL (ref 125–200)
HDL: 58 mg/dL (ref >=46)
LDL Cholesterol: 92 mg/dL (ref <130)
TRIGLYCERIDES: 126 mg/dL (ref <150)
Total CHOL/HDL Ratio: 3 Ratio (ref <=5.0)
VLDL: 25 mg/dL (ref <30)

## 2015-11-21 MED ORDER — SIMVASTATIN 20 MG PO TABS: 20.0000 mg | ORAL_TABLET | Freq: Every day | ORAL | Status: DC

## 2015-11-21 MED ORDER — ASPIRIN 325 MG PO TABS: 325.0000 mg | ORAL_TABLET | Freq: Every day | ORAL | Status: DC

## 2015-11-21 MED ORDER — LISINOPRIL-HYDROCHLOROTHIAZIDE 20-12.5 MG PO TABS: 1.0000 | ORAL_TABLET | Freq: Every day | ORAL | Status: DC

## 2015-11-21 MED ORDER — IRON 325 (65 FE) MG PO TABS: 1.0000 | ORAL_TABLET | Freq: Three times a day (TID) | ORAL | Status: DC

## 2015-11-21 NOTE — Assessment & Plan Note (Addendum)
Normal GI evaluation On iron  No symptoms  CBC today   CBC normal Iron decreased to BID

## 2015-11-21 NOTE — Assessment & Plan Note (Signed)
Lipids today Continue statin

## 2015-11-21 NOTE — Progress Notes (Signed)
F/U HTN Cholesterol Medicine refills  No pain today  No tobacco user  No suicidal thought in the past two weeks

## 2015-11-21 NOTE — Progress Notes (Signed)
Subjective:  Patient ID: Angela [name], female    DOB: [birth year]  Age: 65 y.o. MRN: [id number]  CC: Hypertension and Hyperlipidemia   HPI Angela [name] presents for    1. CHRONIC HYPERTENSION  Disease Monitoring  Blood pressure range: not checking   Chest pain: no   Dyspnea: no   Claudication: no   Medication compliance: yes  Medication Side Effects  Lightheadedness: no   Urinary frequency: no   Edema: no   Impotence:    Preventitive Healthcare:  Exercise: yes   Diet Pattern:   Salt Restriction: yes   2. HLD: taking statin. No myalgias, regular exercise.   3. Anemia: taking iron TID. No fatigue. No blood in stool. Had EGD and colonosopcy;. Both normal in 08/2015.   4.hx of TIA: in 2014. No weakness, numbness, slurred speech. Taking aspirin 325 mg daily.   Social History  Substance Use Topics  . Smoking status: Never Smoker   . Smokeless tobacco: Never Used  . Alcohol Use: No   Outpatient Prescriptions Prior to Visit  Medication Sig Dispense Refill  . aspirin 325 MG tablet Take 325 mg by mouth daily.    . calcium carbonate (OS-CAL) 600 MG TABS Take 600 mg by mouth 2 (two) times daily with a meal.    . Ferrous Sulfate (IRON) 325 (65 FE) MG TABS Take 1 tablet by mouth 3 (three) times daily.    Marland Kitchen. lisinopril-hydrochlorothiazide (PRINZIDE,ZESTORETIC) 20-12.5 MG tablet Take 1 tablet by mouth daily. Must have office visit for refills 30 tablet 0  . Multiple Vitamin (MULTIVITAMIN) tablet Take 1 tablet by mouth daily.    . simvastatin (ZOCOR) 20 MG tablet TAKE 1 TABLET BY MOUTH AT BEDTIME 30 tablet 3   No facility-administered medications prior to visit.    ROS Review of Systems  Constitutional: Negative for fever and chills.  Eyes: Negative for visual disturbance.  Respiratory: Negative for shortness of breath.   Cardiovascular: Negative for chest pain.  Gastrointestinal: Negative for abdominal pain and blood in stool.  Musculoskeletal: Negative for myalgias,  back pain and arthralgias.  Skin: Negative for rash.  Allergic/Immunologic: Negative for immunocompromised state.  Neurological: Negative for dizziness, facial asymmetry, speech difficulty and weakness.  Hematological: Negative for adenopathy. Does not bruise/bleed easily.  Psychiatric/Behavioral: Negative for suicidal ideas and dysphoric mood.    Objective:  BP 117/71 mmHg  Pulse 80  Temp(Src) 97.9 F (36.6 C) (Oral)  Resp 16  Ht 5\' 2"  (1.575 m)  Wt 110 lb (49.896 kg)  BMI 20.11 kg/m2  SpO2 99%  BP/Weight 11/21/2015 08/14/2015 06/13/2015  Systolic BP 117 118 130  Diastolic BP 71 71 60  Wt. (Lbs) 110 104 104.6  BMI 20.11 19.02 19.13    Physical Exam  Constitutional: She is oriented to person, place, and time. She appears well-developed and well-nourished. No distress.  HENT:  Head: Normocephalic and atraumatic.  Cardiovascular: Normal rate, regular rhythm, normal heart sounds and intact distal pulses.   Pulmonary/Chest: Effort normal and breath sounds normal.  Musculoskeletal: She exhibits no edema.  Neurological: She is alert and oriented to person, place, and time.  Skin: Skin is warm and dry. No rash noted.  Psychiatric: She has a normal mood and affect.   Lab Results  Component Value Date   HGBA1C 5.60 11/21/2015    Lab Results  Component Value Date   HGBA1C 6.0* 06/03/2015    Assessment & Plan:   Problem List Items Addressed This Visit    Absolute anemia (  Chronic)   Relevant Medications   Ferrous Sulfate (IRON) 325 (65 Fe) MG TABS   Other Relevant Orders   CBC   Essential hypertension (Chronic)   Relevant Medications   lisinopril-hydrochlorothiazide (PRINZIDE,ZESTORETIC) 20-12.5 MG tablet   simvastatin (ZOCOR) 20 MG tablet   aspirin 325 MG tablet   Other Relevant Orders   COMPLETE METABOLIC PANEL WITH GFR   Hx-TIA (transient ischemic attack) (Chronic)   Relevant Medications   aspirin 325 MG tablet   Hyperlipidemia (Chronic)   Relevant Medications    lisinopril-hydrochlorothiazide (PRINZIDE,ZESTORETIC) 20-12.5 MG tablet   simvastatin (ZOCOR) 20 MG tablet   aspirin 325 MG tablet   Other Relevant Orders   Lipid Panel    Other Visit Diagnoses    Healthcare maintenance    -  Primary    Relevant Orders    POCT glycosylated hemoglobin (Hb A1C) (Completed)    Need for hepatitis C screening test        Relevant Orders    Hepatitis C antibody, reflex       No orders of the defined types were placed in this encounter.    Follow-up: No Follow-up on file.   Dessa Phi MD

## 2015-11-21 NOTE — Assessment & Plan Note (Signed)
Well controlled Compliant with meds CMP Continue current management

## 2015-11-21 NOTE — Assessment & Plan Note (Addendum)
Continue daily ASA , no symptoms

## 2015-11-21 NOTE — Patient Instructions (Addendum)
Angela [name] was seen today for hypertension and hyperlipidemia.  Diagnoses and all orders for this visit:  Healthcare maintenance -     POCT glycosylated hemoglobin (Hb A1C)  Essential hypertension -     lisinopril-hydrochlorothiazide (PRINZIDE,ZESTORETIC) 20-12.5 MG tablet; Take 1 tablet by mouth daily. Must have office visit for refills -     COMPLETE METABOLIC PANEL WITH GFR  Hyperlipidemia -     simvastatin (ZOCOR) 20 MG tablet; Take 1 tablet (20 mg total) by mouth at bedtime. -     Lipid Panel  Hx-TIA (transient ischemic attack) -     aspirin 325 MG tablet; Take 1 tablet (325 mg total) by mouth daily.  Other iron deficiency anemias -     Ferrous Sulfate (IRON) 325 (65 Fe) MG TABS; Take 1 tablet by mouth 3 (three) times daily. -     CBC  Need for hepatitis C screening test -     Hepatitis C antibody, reflex   F/u in 6 months for HTN and hyperlipidemia   Dr. Armen PickupFunches

## 2015-11-22 LAB — HEPATITIS C ANTIBODY: HCV Ab: NEGATIVE

## 2015-11-24 MED ORDER — IRON 325 (65 FE) MG PO TABS: 1.0000 | ORAL_TABLET | Freq: Two times a day (BID) | ORAL | Status: DC

## 2015-11-24 NOTE — Addendum Note (Signed)
Addended by: Dessa PhiFUNCHES, Ashya Nicolaisen on: 11/24/2015 08:22 AM   Modules accepted: Orders

## 2015-11-25 ENCOUNTER — Telehealth: Payer: Self-pay

## 2015-11-25 NOTE — Telephone Encounter (Signed)
Tried to contact patient this am Patient not available  Message left on voice mail to return our call 

## 2015-11-25 NOTE — Telephone Encounter (Signed)
-----   Message from Dessa Phi, MD sent at 11/24/2015  8:20 AM EST ----- All labs normal except slightly elevated BUN Decrease iron to twice daily Continue simvastatin

## 2015-11-25 NOTE — Telephone Encounter (Signed)
Patient returned phone and is aware of her lab results

## 2015-12-08 MED FILL — SIMVASTATIN 20 MG TABLET: 20 | 30 days supply | Qty: 30 | Fill #0

## 2015-12-08 MED FILL — LISINOPRIL-HCTZ 20-12.5 MG: 20-12.5 | 30 days supply | Qty: 30 | Fill #0

## 2016-01-06 MED FILL — LISINOPRIL-HCTZ 20-12.5 MG: 20-12.5 | 30 days supply | Qty: 30 | Fill #1

## 2016-01-06 MED FILL — SIMVASTATIN 20 MG TABLET: 20 | 30 days supply | Qty: 30 | Fill #1

## 2016-02-03 MED FILL — LISINOPRIL-HCTZ 20-12.5 MG: 20-12.5 | 30 days supply | Qty: 30 | Fill #2

## 2016-02-03 MED FILL — SIMVASTATIN 20 MG TABLET: 20 | 30 days supply | Qty: 30 | Fill #2

## 2016-02-09 ENCOUNTER — Other Ambulatory Visit: Payer: Self-pay

## 2016-02-09 DIAGNOSIS — Z1231 Encounter for screening mammogram for malignant neoplasm of breast: Secondary | ICD-10-CM

## 2016-03-03 ENCOUNTER — Ambulatory Visit
Admission: RE | Admit: 2016-03-03 | Discharge: 2016-03-03 | Disposition: A | Discharge status: Home / Self Care | Payer: BLUE CROSS/BLUE SHIELD | Source: Ambulatory Visit

## 2016-03-03 DIAGNOSIS — Z1231 Encounter for screening mammogram for malignant neoplasm of breast: Secondary | ICD-10-CM

## 2016-03-08 MED FILL — LISINOPRIL-HCTZ 20-12.5 MG: 20-12.5 | 30 days supply | Qty: 30 | Fill #3

## 2016-03-08 MED FILL — SIMVASTATIN 20 MG TABLET: 20 | 30 days supply | Qty: 30 | Fill #3

## 2016-04-06 MED FILL — LISINOPRIL-HCTZ 20-12.5 MG: 20-12.5 | 30 days supply | Qty: 30 | Fill #4

## 2016-04-06 MED FILL — SIMVASTATIN 20 MG TABLET: 20 | 30 days supply | Qty: 30 | Fill #4

## 2016-05-05 MED FILL — LISINOPRIL-HCTZ 20-12.5 MG: 20-12.5 | 30 days supply | Qty: 30 | Fill #5

## 2016-05-05 MED FILL — SIMVASTATIN 20 MG TABLET: 20 | 30 days supply | Qty: 30 | Fill #0

## 2016-06-09 MED FILL — LISINOPRIL-HCTZ 20-12.5 MG: 20-12.5 | 30 days supply | Qty: 30 | Fill #6

## 2016-06-09 MED FILL — SIMVASTATIN 20 MG TABLET: 20 | 30 days supply | Qty: 30 | Fill #1

## 2016-07-13 MED FILL — SIMVASTATIN 20 MG TABLET: 20 | 30 days supply | Qty: 30 | Fill #2

## 2016-07-13 MED FILL — LISINOPRIL-HCTZ 20-12.5 MG: 20-12.5 | 30 days supply | Qty: 30 | Fill #7

## 2016-08-10 MED FILL — SIMVASTATIN 20 MG TABLET: 20 | 30 days supply | Qty: 30 | Fill #3

## 2016-08-10 MED FILL — LISINOPRIL-HCTZ 20-12.5 MG: 20-12.5 | 30 days supply | Qty: 30 | Fill #8

## 2016-09-13 MED FILL — LISINOPRIL-HCTZ 20-12.5 MG: 20-12.5 | 30 days supply | Qty: 30 | Fill #9

## 2016-09-13 MED FILL — SIMVASTATIN 20 MG TABLET: 20 | 30 days supply | Qty: 30 | Fill #4

## 2016-10-13 MED FILL — LISINOPRIL-HCTZ 20-12.5 MG: 20-12.5 | 30 days supply | Qty: 30 | Fill #10

## 2016-10-13 MED FILL — SIMVASTATIN 20 MG TABLET: 20 | 30 days supply | Qty: 30 | Fill #5

## 2016-11-30 ENCOUNTER — Other Ambulatory Visit: Payer: Self-pay | Admitting: Family Medicine

## 2016-11-30 DIAGNOSIS — I10 Essential (primary) hypertension: Secondary | ICD-10-CM

## 2016-11-30 DIAGNOSIS — E785 Hyperlipidemia, unspecified: Secondary | ICD-10-CM

## 2016-11-30 MED FILL — SIMVASTATIN 20 MG TABLET: 20 | 30 days supply | Qty: 30 | Fill #0

## 2016-11-30 MED FILL — LISINOPRIL-HCTZ 20-12.5 MG: 20-12.5 | 30 days supply | Qty: 30 | Fill #0

## 2016-12-20 ENCOUNTER — Encounter: Payer: Self-pay | Admitting: Family Medicine

## 2016-12-20 ENCOUNTER — Ambulatory Visit: Payer: BLUE CROSS/BLUE SHIELD | Attending: Family Medicine | Admitting: Family Medicine

## 2016-12-20 VITALS — BP 133/73 | HR 81 | Temp 98.1°F | Ht 62.0 in | Wt 111.6 lb

## 2016-12-20 DIAGNOSIS — Z8673 Personal history of transient ischemic attack (TIA), and cerebral infarction without residual deficits: Secondary | ICD-10-CM | POA: Diagnosis not present

## 2016-12-20 DIAGNOSIS — E7800 Pure hypercholesterolemia, unspecified: Secondary | ICD-10-CM | POA: Diagnosis not present

## 2016-12-20 DIAGNOSIS — R490 Dysphonia: Secondary | ICD-10-CM | POA: Diagnosis not present

## 2016-12-20 DIAGNOSIS — Z862 Personal history of diseases of the blood and blood-forming organs and certain disorders involving the immune mechanism: Secondary | ICD-10-CM

## 2016-12-20 DIAGNOSIS — Z1231 Encounter for screening mammogram for malignant neoplasm of breast: Secondary | ICD-10-CM

## 2016-12-20 DIAGNOSIS — Z7982 Long term (current) use of aspirin: Secondary | ICD-10-CM | POA: Diagnosis not present

## 2016-12-20 DIAGNOSIS — Z0001 Encounter for general adult medical examination with abnormal findings: Secondary | ICD-10-CM | POA: Insufficient documentation

## 2016-12-20 DIAGNOSIS — I10 Essential (primary) hypertension: Secondary | ICD-10-CM

## 2016-12-20 DIAGNOSIS — J040 Acute laryngitis: Secondary | ICD-10-CM

## 2016-12-20 DIAGNOSIS — Z79899 Other long term (current) drug therapy: Secondary | ICD-10-CM | POA: Diagnosis not present

## 2016-12-20 DIAGNOSIS — E611 Iron deficiency: Secondary | ICD-10-CM

## 2016-12-20 DIAGNOSIS — Z23 Encounter for immunization: Secondary | ICD-10-CM

## 2016-12-20 LAB — COMPLETE METABOLIC PANEL WITH GFR
ALK PHOS: 76 U/L (ref 33–130)
ALT: 18 U/L (ref 6–29)
AST: 30 U/L (ref 10–35)
Albumin: 3.9 g/dL (ref 3.6–5.1)
BILIRUBIN TOTAL: 0.3 mg/dL (ref 0.2–1.2)
BUN: 19 mg/dL (ref 7–25)
CO2: 28 mmol/L (ref 20–31)
Calcium: 9 mg/dL (ref 8.6–10.4)
Chloride: 103 mmol/L (ref 98–110)
Creat: 0.99 mg/dL (ref 0.50–0.99)
GFR, EST NON AFRICAN AMERICAN: 60 mL/min (ref >=60)
GFR, Est African American: 70 mL/min (ref >=60)
GLUCOSE: 107 mg/dL — AB (ref 65–99)
Potassium: 4 mmol/L (ref 3.5–5.3)
SODIUM: 141 mmol/L (ref 135–146)
TOTAL PROTEIN: 7.3 g/dL (ref 6.1–8.1)

## 2016-12-20 LAB — IRON AND TIBC
%SAT: 7 % — AB (ref 11–50)
Iron: 22 ug/dL — ABNORMAL LOW (ref 45–160)
TIBC: 305 ug/dL (ref 250–450)
UIBC: 283 ug/dL (ref 125–400)

## 2016-12-20 LAB — LIPID PANEL
Cholesterol: 161 mg/dL (ref <200)
HDL: 63 mg/dL (ref >50)
LDL CALC: 79 mg/dL (ref <100)
Total CHOL/HDL Ratio: 2.6 Ratio (ref <5.0)
Triglycerides: 96 mg/dL (ref <150)
VLDL: 19 mg/dL (ref <30)

## 2016-12-20 LAB — CBC
HCT: 36.7 % (ref 35.0–45.0)
Hemoglobin: 12.1 g/dL (ref 11.7–15.5)
MCH: 29.2 pg (ref 27.0–33.0)
MCHC: 33 g/dL (ref 32.0–36.0)
MCV: 88.4 fL (ref 80.0–100.0)
MPV: 10.4 fL (ref 7.5–12.5)
PLATELETS: 207 10*3/uL (ref 140–400)
RBC: 4.15 MIL/uL (ref 3.80–5.10)
RDW: 13.2 % (ref 11.0–15.0)
WBC: 5.7 10*3/uL (ref 3.8–10.8)

## 2016-12-20 LAB — FERRITIN: FERRITIN: 405 ng/mL — AB (ref 20–288)

## 2016-12-20 MED ORDER — ASPIRIN 325 MG PO TABS: 325.0000 mg | ORAL_TABLET | Freq: Every day | ORAL | 11 refills | Status: DC

## 2016-12-20 MED ORDER — SIMVASTATIN 20 MG PO TABS: 20.0000 mg | ORAL_TABLET | Freq: Every day | ORAL | 11 refills | Status: DC

## 2016-12-20 MED ORDER — ZOSTER VACCINE LIVE 19400 UNT/0.65ML ~~LOC~~ SUSR: 0.6500 mL | Freq: Once | SUBCUTANEOUS | 0 refills | Status: AC

## 2016-12-20 MED ORDER — LISINOPRIL-HYDROCHLOROTHIAZIDE 20-12.5 MG PO TABS: 1.0000 | ORAL_TABLET | Freq: Every day | ORAL | 11 refills | Status: DC

## 2016-12-20 NOTE — Progress Notes (Addendum)
SUBJECTIVE:  66 y.o. female for annual routine Pap and checkup. She declines flu vaccine. She developed hoarseness this morning. She denies sore throat, headaches, chest pain, shortness of breath or cough.   Current Outpatient Prescriptions  Medication Sig Dispense Refill  . aspirin 325 MG tablet Take 1 tablet (325 mg total) by mouth daily. 30 tablet 11  . calcium carbonate (OS-CAL) 600 MG TABS Take 600 mg by mouth 2 (two) times daily with a meal.    . Ferrous Sulfate (IRON) 325 (65 Fe) MG TABS Take 1 tablet by mouth 2 (two) times daily. 60 each 11  . lisinopril-hydrochlorothiazide (PRINZIDE,ZESTORETIC) 20-12.5 MG tablet TAKE 1 TABLET BY MOUTH DAILY. MUST HAVE OFFICE VISIT FOR REFILLS 30 tablet 0  . Multiple Vitamin (MULTIVITAMIN) tablet Take 1 tablet by mouth daily.    . simvastatin (ZOCOR) 20 MG tablet TAKE 1 TABLET BY MOUTH AT BEDTIME. 30 tablet 0   No current facility-administered medications for this visit.    Allergies: Patient has no known allergies.  No LMP recorded. Patient is postmenopausal.  ROS:  Feeling well. No dyspnea or chest pain on exertion.  No abdominal pain, change in bowel habits, black or bloody stools.  No urinary tract symptoms. GYN ROS: no breast pain or new or enlarging lumps on self exam, no vaginal bleeding. No neurological complaints.  OBJECTIVE:  The patient appears well, alert, oriented x 3, in no distress. BP 133/73 (BP Location: Left Arm, Patient Position: Sitting, Cuff Size: Small)   Pulse 81   Temp 98.1 F (36.7 C) (Oral)   Ht 5\' 2"  (1.575 m)   Wt 111 lb 9.6 oz (50.6 kg)   SpO2 97%   BMI 20.41 kg/m   Wt Readings from Last 3 Encounters:  12/20/16 111 lb 9.6 oz (50.6 kg)  11/21/15 110 lb (49.9 kg)  08/14/15 104 lb (47.2 kg)  HEENT normal. Systolic murmur 2/5.  Neck supple. No adenopathy or thyromegaly. PERLA. Lungs are clear, good air entry, no wheezes, rhonchi or rales. S1 and S2 normal, no murmurs, regular rate and rhythm. Abdomen soft without  tenderness, guarding, mass or organomegaly. Extremities show no edema, normal peripheral pulses. Neurological is normal, no focal findings.  BREAST EXAM: breasts appear normal, no suspicious masses, no skin or nipple changes or axillary nodes  PELVIC EXAM: examination not indicated  ASSESSMENT:  well woman  PLAN:  mammogram return annually or prn

## 2016-12-20 NOTE — Patient Instructions (Addendum)
Angela [name] was seen today for annual exam.  Diagnoses and all orders for this visit:  Essential hypertension -     COMPLETE METABOLIC PANEL WITH GFR -     lisinopril-hydrochlorothiazide (PRINZIDE,ZESTORETIC) 20-12.5 MG tablet; Take 1 tablet by mouth daily.  Pure hypercholesterolemia -     Lipid Panel -     simvastatin (ZOCOR) 20 MG tablet; Take 1 tablet (20 mg total) by mouth at bedtime.  History of anemia -     CBC -     Iron and TIBC -     Ferritin  Hx-TIA (transient ischemic attack) -     aspirin 325 MG tablet; Take 1 tablet (325 mg total) by mouth daily.  Acute viral laryngitis   F/u in one year sooner if needed  Dr. Armen PickupFunches    Laryngitis Introduction Laryngitis is swelling (inflammation) of your vocal cords. This causes hoarseness, coughing, loss of voice, sore throat, or a dry throat. When your vocal cords are inflamed, your voice sounds different. Laryngitis can be temporary (acute) or long-term (chronic). Most cases of acute laryngitis improve with time. Chronic laryngitis is laryngitis that lasts for more than three weeks. Follow these instructions at home:  Drink enough fluid to keep your pee (urine) clear or pale yellow.  Breathe in moist air. Use a humidifier if you live in a dry climate.  Take medicines only as told by your doctor.  Do not smoke cigarettes or electronic cigarettes. If you need help quitting, ask your doctor.  Talk as little as possible. Also avoid whispering, which can cause vocal strain.  Write instead of talking. Do this until your voice is back to normal. Contact a doctor if:  You have a fever.  Your pain is worse.  You have trouble swallowing. Get help right away if:  You cough up blood.  You have trouble breathing. This information is not intended to replace advice given to you by your health care provider. Make sure you discuss any questions you have with your health care provider. Document Released: 10/14/2011 Document  Revised: 04/01/2016 Document Reviewed: 04/09/2014  2017 [employer]vier

## 2016-12-21 DIAGNOSIS — E611 Iron deficiency: Secondary | ICD-10-CM | POA: Insufficient documentation

## 2016-12-21 MED ORDER — IRON 325 (65 FE) MG PO TABS: 1.0000 | ORAL_TABLET | Freq: Three times a day (TID) | ORAL | 11 refills | Status: DC

## 2016-12-21 NOTE — Addendum Note (Signed)
Addended by: Dessa PhiFUNCHES, Elizabethann Lackey on: 12/21/2016 07:44 AM   Modules accepted: Orders

## 2016-12-22 ENCOUNTER — Telehealth: Payer: Self-pay

## 2016-12-22 NOTE — Telephone Encounter (Signed)
Pt was called and informed of lab results and to take her iron medication 3 times a day.

## 2016-12-29 MED FILL — SIMVASTATIN 20 MG TABLET: 20 | 30 days supply | Qty: 30 | Fill #0

## 2016-12-29 MED FILL — LISINOPRIL-HCTZ 20-12.5 MG: 20-12.5 | 30 days supply | Qty: 30 | Fill #0

## 2017-02-01 MED FILL — LISINOPRIL-HCTZ 20-12.5 MG: 20-12.5 | 30 days supply | Qty: 30 | Fill #1

## 2017-02-01 MED FILL — SIMVASTATIN 20 MG TABLET: 20 | 30 days supply | Qty: 30 | Fill #1

## 2017-03-04 ENCOUNTER — Ambulatory Visit
Admission: RE | Admit: 2017-03-04 | Discharge: 2017-03-04 | Disposition: A | Discharge status: Home / Self Care | Payer: BLUE CROSS/BLUE SHIELD | Source: Ambulatory Visit | Attending: Family Medicine | Admitting: Family Medicine

## 2017-03-04 DIAGNOSIS — Z1231 Encounter for screening mammogram for malignant neoplasm of breast: Secondary | ICD-10-CM

## 2017-03-09 MED FILL — SIMVASTATIN 20 MG TABLET: 20 | 30 days supply | Qty: 30 | Fill #2

## 2017-03-09 MED FILL — LISINOPRIL-HCTZ 20-12.5 MG: 20-12.5 | 30 days supply | Qty: 30 | Fill #2

## 2017-03-23 ENCOUNTER — Encounter: Payer: Self-pay | Admitting: Family Medicine

## 2017-04-06 MED FILL — LISINOPRIL-HCTZ 20-12.5 MG: 20-12.5 | 30 days supply | Qty: 30 | Fill #3

## 2017-04-06 MED FILL — SIMVASTATIN 20 MG TABLET: 20 | 30 days supply | Qty: 30 | Fill #3

## 2017-05-10 MED FILL — LISINOPRIL-HCTZ 20-12.5 MG: 20-12.5 | 30 days supply | Qty: 30 | Fill #4

## 2017-05-10 MED FILL — SIMVASTATIN 20 MG TABLET: 20 | 30 days supply | Qty: 30 | Fill #4

## 2017-06-14 MED FILL — LISINOPRIL-HCTZ 20-12.5 MG: 20-12.5 | 30 days supply | Qty: 30 | Fill #5

## 2017-06-14 MED FILL — SIMVASTATIN 20 MG TABLET: 20 | 30 days supply | Qty: 30 | Fill #5

## 2017-07-12 MED FILL — SIMVASTATIN 20 MG TABLET: 20 | 30 days supply | Qty: 30 | Fill #6

## 2017-07-12 MED FILL — LISINOPRIL-HCTZ 20-12.5 MG: 20-12.5 | 30 days supply | Qty: 30 | Fill #6

## 2017-08-15 MED FILL — LISINOPRIL-HCTZ 20-12.5 MG: 20-12.5 | 30 days supply | Qty: 30 | Fill #7

## 2017-08-15 MED FILL — SIMVASTATIN 20 MG TABLET: 20 | 30 days supply | Qty: 30 | Fill #7

## 2017-09-19 MED FILL — SIMVASTATIN 20 MG TABLET: 20 | 30 days supply | Qty: 30 | Fill #8

## 2017-09-19 MED FILL — LISINOPRIL-HCTZ 20-12.5 MG: 20-12.5 | 30 days supply | Qty: 30 | Fill #8

## 2017-10-24 MED FILL — SIMVASTATIN 20 MG TABLET: 20 | 30 days supply | Qty: 30 | Fill #9

## 2017-10-24 MED FILL — LISINOPRIL-HCTZ 20-12.5 MG: 20-12.5 | 30 days supply | Qty: 30 | Fill #9

## 2017-11-21 MED FILL — LISINOPRIL-HCTZ 20-12.5 MG: 20-12.5 | 30 days supply | Qty: 30 | Fill #10

## 2017-11-21 MED FILL — SIMVASTATIN 20 MG TABLET: 20 | 30 days supply | Qty: 30 | Fill #10

## 2017-11-22 DIAGNOSIS — H2513 Age-related nuclear cataract, bilateral: Secondary | ICD-10-CM | POA: Diagnosis not present

## 2018-01-04 ENCOUNTER — Other Ambulatory Visit: Payer: Self-pay

## 2018-01-04 ENCOUNTER — Ambulatory Visit: Payer: Medicare Other | Admitting: Family Medicine

## 2018-01-04 ENCOUNTER — Encounter: Payer: Self-pay | Admitting: Family Medicine

## 2018-01-04 VITALS — BP 139/73 | HR 74 | Temp 97.6°F | Resp 16 | Ht 62.0 in | Wt 110.0 lb

## 2018-01-04 DIAGNOSIS — I10 Essential (primary) hypertension: Secondary | ICD-10-CM | POA: Diagnosis not present

## 2018-01-04 DIAGNOSIS — E7800 Pure hypercholesterolemia, unspecified: Secondary | ICD-10-CM

## 2018-01-04 DIAGNOSIS — D649 Anemia, unspecified: Secondary | ICD-10-CM

## 2018-01-04 DIAGNOSIS — R7303 Prediabetes: Secondary | ICD-10-CM

## 2018-01-04 DIAGNOSIS — Z23 Encounter for immunization: Secondary | ICD-10-CM | POA: Diagnosis not present

## 2018-01-04 LAB — POCT URINALYSIS DIP (DEVICE)
Bilirubin Urine: NEGATIVE
Glucose, UA: NEGATIVE mg/dL
HGB URINE DIPSTICK: NEGATIVE
KETONES UR: NEGATIVE mg/dL
Leukocytes, UA: NEGATIVE
Nitrite: NEGATIVE
PH: 6 (ref 5.0–8.0)
PROTEIN: NEGATIVE mg/dL
SPECIFIC GRAVITY, URINE: 1.02 (ref 1.005–1.030)
Urobilinogen, UA: 0.2 mg/dL (ref 0.0–1.0)

## 2018-01-04 LAB — POCT GLYCOSYLATED HEMOGLOBIN (HGB A1C): Hemoglobin A1C: 5.6

## 2018-01-04 MED ORDER — SIMVASTATIN 20 MG PO TABS: 20.0000 mg | ORAL_TABLET | Freq: Every day | ORAL | 11 refills | Status: DC

## 2018-01-04 MED ORDER — LISINOPRIL-HYDROCHLOROTHIAZIDE 20-12.5 MG PO TABS: 1.0000 | ORAL_TABLET | Freq: Every day | ORAL | 1 refills | Status: DC

## 2018-01-04 MED FILL — LISINOPRIL-HCTZ 20-12.5 MG: 20-12.5 | 90 days supply | Qty: 90 | Fill #0

## 2018-01-04 MED FILL — SIMVASTATIN 20 MG TABS: 20 | 30 days supply | Qty: 30 | Fill #0

## 2018-01-04 NOTE — Progress Notes (Signed)
Subjective:    Patient ID: Angela [name], female    DOB: 09/23/1951, 67 y.o.   MRN: [id number]  HPI Angela [name], a 67 year old female with a history of hypertension and hyperlipidemia presents to establish care. She was a patient of Dr. Dorthula Rue, but has been lost to follow up over the past year. Patient says that she has been taking lisinopril-hydrochlorothiazide consistently. She follows a low fat diet and exercises routinely. Patient denies headache, chest pain, shortness of breath, bilateral lower extremity edema.   Past Medical History:  Diagnosis Date  . Anemia   . Hyperlipidemia   . Hypertension   . TIA (transient ischemic attack) 2004   Social History   Socioeconomic History  . Marital status: Single    Spouse name: Not on file  . Number of children: 1  . Years of education: Not on file  . Highest education level: Not on file  Social Needs  . Financial resource strain: Not on file  . Food insecurity - worry: Not on file  . Food insecurity - inability: Not on file  . Transportation needs - medical: Not on file  . Transportation needs - non-medical: Not on file  Occupational History  . Occupation: Therapist, nutritional  Tobacco Use  . Smoking status: Never Smoker  . Smokeless tobacco: Never Used  Substance and Sexual Activity  . Alcohol use: No    Alcohol/week: 0.0 oz  . Drug use: No  . Sexual activity: Not on file  Other Topics Concern  . Not on file  Social History Narrative   Single. 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    Immunization History  Administered Date(s) Administered  . PPD Test 12/11/2013  . Pneumococcal Conjugate-13 01/04/2018   Review of Systems  Constitutional: Negative.   HENT: Negative.   Eyes: Negative.  Negative for photophobia and visual disturbance.  Respiratory: Negative.   Cardiovascular: Negative.  Negative for chest pain, palpitations and leg swelling.   Gastrointestinal: Negative.   Endocrine: Negative.  Negative for polydipsia, polyphagia and polyuria.  Genitourinary: Negative.   Musculoskeletal: Negative.   Allergic/Immunologic: Negative.   Neurological: Negative.  Negative for dizziness, facial asymmetry and headaches.  Hematological: Negative.   Psychiatric/Behavioral: Negative.       Objective:   Physical Exam  Constitutional: She is oriented to person, place, and time. She appears well-developed and well-nourished.  HENT:  Head: Normocephalic and atraumatic.  Right Ear: External ear normal.  Left Ear: External ear normal.  Nose: Nose normal.  Mouth/Throat: Oropharynx is clear and moist.  Eyes: Conjunctivae are normal. Pupils are equal, round, and reactive to light.  Neck: Normal range of motion. Neck supple.  Cardiovascular: Normal rate, regular rhythm, normal heart sounds and intact distal pulses.  Pulmonary/Chest: Effort normal and breath sounds normal.  Abdominal: Soft. Bowel sounds are normal.  Neurological: She is alert and oriented to person, place, and time. She has normal reflexes.  Skin: Skin is warm and dry.  Psychiatric: Her behavior is normal. Judgment and thought content normal.      BP 139/73 (BP Location: Left Arm, Patient Position: Sitting, Cuff Size: Normal)   Pulse 74   Temp 97.6 F (36.4 C) (Oral)   Resp 16   Ht 5\' 2"  (1.575 m)   Wt 110 lb (49.9 kg)   SpO2 100%   BMI 20.12 kg/m  Assessment & Plan:   Essential hypertension Blood pressure  is at goal on medication regimen, no changes warranted.  - Continue medication, monitor blood pressure at home. Continue DASH diet. Reminder to go to the ER if any CP, SOB, nausea, dizziness, severe HA, changes vision/speech, left arm numbness and tingling and jaw pain. - Basic Metabolic Panel - EKG 12-Lead - lisinopril-hydrochlorothiazide (PRINZIDE,ZESTORETIC) 20-12.5 MG tablet; Take 1 tablet by mouth daily.  Dispense: 90 tablet; Refill: 1  2. Anemia,  unspecified type - CBC  3. Prediabetes Hemoglobin a1C has improved from 6.2 to 5.6.  Continue carbohydrate modified diet and exercise routine.  - HgB A1c  4. Pure hypercholesterolemia The 10-year ASCVD risk score Denman George(Goff DC Jr., et al., 2013) is: 8.8%   Values used to calculate the score:     Age: [age] years     Sex: Female     Is Non-Hispanic African American: Yes     Diabetic: No     Tobacco smoker: No     Systolic Blood Pressure: 139 mmHg     Is BP treated: Yes     HDL Cholesterol: 63 mg/dL     Total Cholesterol: 161 mg/dL - Lipid Panel; Future  5. Immunization due  - Pneumococcal conjugate vaccine 13-valent   RTC: 3 months for hypertension   Nolon NationsLachina Moore Palyn Scrima  MSN, FNP-C Patient Care [facility] [address]  , [place] [id number] [phone]

## 2018-01-04 NOTE — Patient Instructions (Signed)
We will continue lisinopril hydrochlorothiazide for blood pressure.  Your blood pressure is at goal today no changes warranted.  - Continue medication, monitor blood pressure at home. Continue DASH diet. Reminder to go to the ER if any CP, SOB, nausea, dizziness, severe HA, changes vision/speech, left arm numbness and tingling and jaw pain.     We will follow-up by phone with any abnormal laboratory results.   Will return in 1 month for a routine gynecological exam.   Recommend that you follow-up on your mammogram in April as scheduled.

## 2018-01-05 LAB — CBC
HEMOGLOBIN: 11.4 g/dL (ref 11.1–15.9)
Hematocrit: 34.1 % (ref 34.0–46.6)
MCH: 29.4 pg (ref 26.6–33.0)
MCHC: 33.4 g/dL (ref 31.5–35.7)
MCV: 88 fL (ref 79–97)
PLATELETS: 220 10*3/uL (ref 150–379)
RBC: 3.88 x10E6/uL (ref 3.77–5.28)
RDW: 14.3 % (ref 12.3–15.4)
WBC: 3.6 10*3/uL (ref 3.4–10.8)

## 2018-01-05 LAB — BASIC METABOLIC PANEL
BUN/Creatinine Ratio: 22 (ref 12–28)
BUN: 20 mg/dL (ref 8–27)
CO2: 24 mmol/L (ref 20–29)
CREATININE: 0.93 mg/dL (ref 0.57–1.00)
Calcium: 9.3 mg/dL (ref 8.7–10.3)
Chloride: 105 mmol/L (ref 96–106)
GFR, EST AFRICAN AMERICAN: 75 mL/min/{1.73_m2} (ref >59)
GFR, EST NON AFRICAN AMERICAN: 65 mL/min/{1.73_m2} (ref >59)
Glucose: 88 mg/dL (ref 65–99)
Potassium: 4.4 mmol/L (ref 3.5–5.2)
SODIUM: 143 mmol/L (ref 134–144)

## 2018-01-24 ENCOUNTER — Other Ambulatory Visit: Payer: Self-pay | Admitting: Family Medicine

## 2018-01-24 DIAGNOSIS — Z1231 Encounter for screening mammogram for malignant neoplasm of breast: Secondary | ICD-10-CM

## 2018-02-01 ENCOUNTER — Ambulatory Visit (INDEPENDENT_AMBULATORY_CARE_PROVIDER_SITE_OTHER): Payer: Medicare Other | Admitting: Family Medicine

## 2018-02-01 ENCOUNTER — Encounter: Payer: Self-pay | Admitting: Family Medicine

## 2018-02-01 VITALS — BP 118/66 | HR 75 | Temp 97.5°F | Resp 14 | Ht 62.0 in | Wt 108.0 lb

## 2018-02-01 DIAGNOSIS — Z01419 Encounter for gynecological examination (general) (routine) without abnormal findings: Secondary | ICD-10-CM

## 2018-02-01 DIAGNOSIS — E7800 Pure hypercholesterolemia, unspecified: Secondary | ICD-10-CM | POA: Diagnosis not present

## 2018-02-01 DIAGNOSIS — E611 Iron deficiency: Secondary | ICD-10-CM

## 2018-02-01 LAB — POCT URINALYSIS DIP (DEVICE)
BILIRUBIN URINE: NEGATIVE
Glucose, UA: NEGATIVE mg/dL
Hgb urine dipstick: NEGATIVE
KETONES UR: NEGATIVE mg/dL
LEUKOCYTES UA: NEGATIVE
Nitrite: NEGATIVE
Protein, ur: NEGATIVE mg/dL
Urobilinogen, UA: 0.2 mg/dL (ref 0.0–1.0)
pH: 5 (ref 5.0–8.0)

## 2018-02-01 MED ORDER — IRON 325 (65 FE) MG PO TABS: 1.0000 | ORAL_TABLET | Freq: Every day | ORAL | 11 refills | Status: DC

## 2018-02-01 MED ORDER — SIMVASTATIN 20 MG PO TABS: 20.0000 mg | ORAL_TABLET | Freq: Every day | ORAL | 1 refills | Status: DC

## 2018-02-01 MED FILL — SIMVASTATIN 20 MG TABS: 20 | 90 days supply | Qty: 90 | Fill #0

## 2018-02-01 NOTE — Patient Instructions (Addendum)
Your blood pressure is at goal on current medication regimen, no changes warranted. -  Continue medication, monitor blood pressure at home. Continue DASH diet. Reminder to go to the ER if any CP, SOB, nausea, dizziness, severe HA, changes vision/speech, left arm numbness and tingling and jaw pain.   Pap Test Why am I having this test? A pap test is sometimes called a pap smear. It is a screening test that is used to check for signs of cancer of the vagina, cervix, and uterus. The test can also identify the presence of infection or precancerous changes. Your health care provider will likely recommend you have this test done on a regular basis. This test may be done:  Every 3 years, starting at age 66.  Every 5 years, in combination with testing for the presence of human papillomavirus (HPV).  More or less often depending on other medical conditions.  What kind of sample is taken? Using a small cotton swab, plastic spatula, or brush, your health care provider will collect a sample of cells from the surface of your cervix. Your cervix is the opening to your uterus, also called a womb. Secretions from the cervix and vagina may also be collected. How do I prepare for this test?  Be aware of where you are in your menstrual cycle. You may be asked to reschedule the test if you are menstruating on the day of the test.  You may need to reschedule if you have a known vaginal infection on the day of the test.  You may be asked to avoid douching or taking a bath the day before or the day of the test.  Some medicines can cause abnormal test results, such as digitalis and tetracycline. Talk with your health care provider before your test if you take one of these medicines. What do the results mean? Abnormal test results may indicate a number of health conditions. These may include:  Cancer. Although pap test results cannot be used to diagnose cancer of the cervix, vagina, or uterus, they may suggest  the possibility of cancer. Further tests would be required to determine if cancer is present.  Sexually transmitted disease.  Fungal infection.  Parasite infection.  Herpes infection.  A condition causing or contributing to infertility.  It is your responsibility to obtain your test results. Ask the lab or department performing the test when and how you will get your results. Contact your health care provider to discuss any questions you have about your results. Talk with your health care provider to discuss your results, treatment options, and if necessary, the need for more tests. Talk with your health care provider if you have any questions about your results. This information is not intended to replace advice given to you by your health care provider. Make sure you discuss any questions you have with your health care provider. Document Released: 01/15/2003 Document Revised: 06/30/2016 Document Reviewed: 03/18/2014 Elsevier Interactive Patient Education  [employer]2018 Elsevier Inc.

## 2018-02-01 NOTE — Progress Notes (Signed)
Subjective:     Angela [name] is a 67 y.o. female with a history of anemia, hypertension, and hyperlipidemia presents for a routine gynecological exam. Patient is taking all medications consistently and following a low sodium diet. She does not check blood pressure at home. She denies chest pain, shortness of breath, heart palpitations, lower extremity edema, nausea, vomiting, or diarrhea.  Patient is also here for a routine gynecological exam. Patient is post menopausal. She says that her last pap smear was 2 years ago and was normal. She had 1 vaginal birth in 28. She is not sexually active. She denies vaginal discharge, vaginal dryness, urinary frequency, urgency, or dysuria. Patient is up to date with mammograms.    Past Medical History:  Diagnosis Date  . Anemia   . Hyperlipidemia   . Hypertension   . TIA (transient ischemic attack) 2004   Social History   Socioeconomic History  . Marital status: Single    Spouse name: Not on file  . Number of children: 1  . Years of education: Not on file  . Highest education level: Not on file  Occupational History  . Occupation: Therapist, nutritional  Social Needs  . Financial resource strain: Not on file  . Food insecurity:    Worry: Not on file    Inability: Not on file  . Transportation needs:    Medical: Not on file    Non-medical: Not on file  Tobacco Use  . Smoking status: Never Smoker  . Smokeless tobacco: Never Used  Substance and Sexual Activity  . Alcohol use: No    Alcohol/week: 0.0 oz  . Drug use: No  . Sexual activity: Not on file  Lifestyle  . Physical activity:    Days per week: Not on file    Minutes per session: Not on file  . Stress: Not on file  Relationships  . Social connections:    Talks on phone: Not on file    Gets together: Not on file    Attends religious service: Not on file    Active member of club or organization: Not on file    Attends meetings of clubs or organizations: Not on file    Relationship  status: Not on file  . Intimate partner violence:    Fear of current or ex partner: Not on file    Emotionally abused: Not on file    Physically abused: Not on file    Forced sexual activity: Not on file  Other Topics Concern  . Not on file  Social History Narrative   Single. 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    Immunization History  Administered Date(s) Administered  . PPD Test 12/11/2013  . Pneumococcal Conjugate-13 01/04/2018   Gynecologic History No LMP recorded. Patient is postmenopausal.  Obstetric History OB History  No data available   Review of Systems  Constitutional: Negative.   HENT: Negative.   Eyes: Negative.   Respiratory: Negative.   Cardiovascular: Negative.   Gastrointestinal: Negative.   Genitourinary: Negative.   Musculoskeletal: Negative.   Skin: Negative.   Neurological: Negative.   Endo/Heme/Allergies: Negative.   Psychiatric/Behavioral: Negative.    Objective:  Physical Exam  Constitutional: She is oriented to person, place, and time.  Pulmonary/Chest: Effort normal and breath sounds normal.  Abdominal: Soft. Bowel sounds are normal.  Genitourinary: Vagina normal and uterus normal. There is no tenderness on the right labia. There is no  tenderness on the left labia. Cervix exhibits no motion tenderness, no discharge and no friability. Right adnexum displays no tenderness. Left adnexum displays no tenderness. No vaginal discharge found.  Neurological: She is alert and oriented to person, place, and time.  Skin: Skin is warm and dry.  Psychiatric: She has a normal mood and affect. Her behavior is normal. Judgment and thought content normal.    Assessment:    Pap smear, as part of routine gynecological examination Mammogram up to date.  - POCT urinalysis dip (device) - IGP, Aptima HPV, rfx 16/18,45   Pure hypercholesterolemia The 10-year ASCVD risk score Denman George(Goff DC Jr.,  et al., 2013) is: 6%   Values used to calculate the score:     Age: [age] years     Sex: Female     Is Non-Hispanic African American: Yes     Diabetic: No     Tobacco smoker: No     Systolic Blood Pressure: 118 mmHg     Is BP treated: Yes     HDL Cholesterol: 63 mg/dL     Total Cholesterol: 161 mg/dL  - simvastatin (ZOCOR) 20 MG tablet; Take 1 tablet (20 mg total) by mouth at bedtime.  Dispense: 90 tablet; Refill: 1  Iron deficiency - Ferrous Sulfate (IRON) 325 (65 Fe) MG TABS; Take 1 tablet (325 mg total)  by mouth daily.  Dispense: 90 each; Refill: 11      Nolon NationsLachina Moore Marketia Stallsmith  MSN, FNP-C Patient [facility] [address]  [place], [place] [address] [phone]

## 2018-02-04 LAB — IGP, APTIMA HPV, RFX 16/18,45
HPV APTIMA: NEGATIVE
PAP Smear Comment: 0

## 2018-02-07 ENCOUNTER — Telehealth: Payer: Self-pay

## 2018-02-07 NOTE — Telephone Encounter (Signed)
-----   Message from Massie MaroonLachina M Hollis, [place] sent at 02/04/2018 10:41 AM EDT ----- Regarding: lab results Please inform patient that pap smear is negative. Typically, do not repeat pap smears after age 66 according to guidelines. If symptoms of abnormal vaginal bleeding or discharge occur. Please notify office.   Angela NationsLachina Moore Hollis  MSN, FNP-C Patient Care [facility] [address]  [place], [place] [address] [phone]

## 2018-02-07 NOTE — Telephone Encounter (Signed)
Called and spoke with patient. Advised that pap smear is negative and we do not typically repeat pap smears after age 66. Advised that if patient has any abnormal vaginal bleeding or discharge occur to notify office. Patient verbalized understanding. Thanks!

## 2018-03-06 ENCOUNTER — Ambulatory Visit
Admission: RE | Admit: 2018-03-06 | Discharge: 2018-03-06 | Disposition: A | Discharge status: Home / Self Care | Payer: Medicare Other | Source: Ambulatory Visit | Attending: Family Medicine | Admitting: Family Medicine

## 2018-03-06 DIAGNOSIS — Z1231 Encounter for screening mammogram for malignant neoplasm of breast: Secondary | ICD-10-CM | POA: Diagnosis not present

## 2018-03-28 MED FILL — LISINOPRIL-HCTZ 20-12.5 TAB: 20-12.5 | 90 days supply | Qty: 90 | Fill #1

## 2018-05-03 MED FILL — SIMVASTATIN 20 MG TABLET: 20 | 90 days supply | Qty: 90 | Fill #1

## 2018-05-04 ENCOUNTER — Encounter: Payer: Self-pay | Admitting: Family Medicine

## 2018-05-04 ENCOUNTER — Ambulatory Visit (INDEPENDENT_AMBULATORY_CARE_PROVIDER_SITE_OTHER): Payer: Medicare Other | Admitting: Family Medicine

## 2018-05-04 VITALS — BP 114/63 | HR 72 | Temp 98.1°F | Resp 16 | Ht 62.0 in | Wt 104.0 lb

## 2018-05-04 DIAGNOSIS — Z9189 Other specified personal risk factors, not elsewhere classified: Secondary | ICD-10-CM | POA: Diagnosis not present

## 2018-05-04 DIAGNOSIS — I10 Essential (primary) hypertension: Secondary | ICD-10-CM

## 2018-05-04 DIAGNOSIS — E7800 Pure hypercholesterolemia, unspecified: Secondary | ICD-10-CM

## 2018-05-04 LAB — POCT URINALYSIS DIPSTICK
BILIRUBIN UA: NEGATIVE
Glucose, UA: NEGATIVE
Ketones, UA: NEGATIVE
Leukocytes, UA: NEGATIVE
Nitrite, UA: NEGATIVE
PROTEIN UA: NEGATIVE
RBC UA: NEGATIVE
Spec Grav, UA: 1.025 (ref 1.010–1.025)
Urobilinogen, UA: 0.2 E.U./dL
pH, UA: 5 (ref 5.0–8.0)

## 2018-05-04 NOTE — Progress Notes (Signed)
Subjective:    Patient ID: Angela [name], female    DOB: 10-28-1951, 67 y.o.   MRN: [id number]  Angela [name], a 67 year old female with a history of hypertension and hyperlipidemia presents for follow-up of chronic condition.  Patient states that she feels well and is without complaint.  She has been taking medication consistently and remaining active.  She does not monitor blood pressure at home.  Hypertension  The problem is controlled. Pertinent negatives include no anxiety, blurred vision, chest pain, headaches, malaise/fatigue, neck pain, palpitations, peripheral edema, PND, shortness of breath or sweats. Risk factors for coronary artery disease include dyslipidemia. Past treatments include diuretics and ACE inhibitors. The current treatment provides mild improvement. Compliance problems include diet.  There is no history of angina, kidney disease, CAD/MI, CVA, heart failure, PVD or retinopathy. There is no history of sleep apnea or a thyroid problem.    Past Medical History:  Diagnosis Date  . Anemia   . Hyperlipidemia   . Hypertension   . TIA (transient ischemic attack) 2004   Social History   Socioeconomic History  . Marital status: Single    Spouse name: Not on file  . Number of children: 1  . Years of education: Not on file  . Highest education level: Not on file  Occupational History  . Occupation: Therapist, nutritionalcleaning tech  Social Needs  . Financial resource strain: Not on file  . Food insecurity:    Worry: Not on file    Inability: Not on file  . Transportation needs:    Medical: Not on file    Non-medical: Not on file  Tobacco Use  . Smoking status: Never Smoker  . Smokeless tobacco: Never Used  Substance and Sexual Activity  . Alcohol use: No    Alcohol/week: 0.0 oz  . Drug use: No  . Sexual activity: Not on file  Lifestyle  . Physical activity:    Days per week: Not on file    Minutes per session: Not on file  . Stress: Not on file  Relationships  . Social  connections:    Talks on phone: Not on file    Gets together: Not on file    Attends religious service: Not on file    Active member of club or organization: Not on file    Attends meetings of clubs or organizations: Not on file    Relationship status: Not on file  . Intimate partner violence:    Fear of current or ex partner: Not on file    Emotionally abused: Not on file    Physically abused: Not on file    Forced sexual activity: Not on file  Other Topics Concern  . Not on file  Social History Narrative   Single. 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    Immunization History  Administered Date(s) Administered  . PPD Test 12/11/2013  . Pneumococcal Conjugate-13 01/04/2018   Review of Systems  Constitutional: Negative.  Negative for malaise/fatigue.  HENT: Negative.   Eyes: Negative.  Negative for blurred vision, photophobia and visual disturbance.  Respiratory: Negative.  Negative for shortness of breath.   Cardiovascular: Negative.  Negative for chest pain, palpitations, leg swelling and PND.  Gastrointestinal: Negative.   Endocrine: Negative.  Negative for polydipsia, polyphagia and polyuria.  Genitourinary: Negative.   Musculoskeletal: Negative.  Negative for neck pain.  Allergic/Immunologic: Negative.   Neurological: Negative.  Negative for dizziness, facial asymmetry  and headaches.  Hematological: Negative.   Psychiatric/Behavioral: Negative.       Objective:   Physical Exam  Constitutional: She is oriented to person, place, and time. She appears well-developed and well-nourished.  HENT:  Head: Normocephalic and atraumatic.  Right Ear: External ear normal.  Left Ear: External ear normal.  Nose: Nose normal.  Mouth/Throat: Oropharynx is clear and moist.  Eyes: Pupils are equal, round, and reactive to light. Conjunctivae are normal.  Neck: Normal range of motion. Neck supple.  Cardiovascular:  Normal rate, regular rhythm, normal heart sounds and intact distal pulses.  Pulmonary/Chest: Effort normal and breath sounds normal.  Abdominal: Soft. Bowel sounds are normal.  Neurological: She is alert and oriented to person, place, and time. She has normal reflexes.  Skin: Skin is warm and dry.  Psychiatric: Her behavior is normal. Judgment and thought content normal.      BP 114/63 (BP Location: Left Arm, Patient Position: Sitting, Cuff Size: Normal)   Pulse 72   Temp 98.1 F (36.7 C) (Oral)   Resp 16   Ht 5\' 2"  (1.575 m)   Wt 104 lb (47.2 kg)   SpO2 100%   BMI 19.02 kg/m  Assessment & Plan:   1. Essential hypertension Blood pressure is at goal on current medication regimen, no changes warranted on today. Continue DASH diet as discussed Will review renal functioning as results become available - Urinalysis Dipstick - Basic Metabolic Panel  2. Pure hypercholesterolemia The 10-year ASCVD risk score Denman George DC Jr., et al., 2013) is: 5.5%   Values used to calculate the score:     Age: 67 years     Sex: Female     Is Non-Hispanic African American: Yes     Diabetic: No     Tobacco smoker: No     Systolic Blood Pressure: 114 mmHg     Is BP treated: Yes     HDL Cholesterol: 63 mg/dL     Total Cholesterol: 161 mg/dL - Lipid Panel   RTC: 6 months for follow-up of chronic conditions   Nolon Nations  MSN, FNP-C Patient Care [facility] [address] [place], [place] [address] [phone]

## 2018-05-04 NOTE — Patient Instructions (Signed)
DASH Eating Plan DASH stands for "Dietary Approaches to Stop Hypertension." The DASH eating plan is a healthy eating plan that has been shown to reduce high blood pressure (hypertension). It may also reduce your risk for type 2 diabetes, heart disease, and stroke. The DASH eating plan may also help with weight loss. What are tips for following this plan? General guidelines  Avoid eating more than 2,300 mg (milligrams) of salt (sodium) a day. If you have hypertension, you may need to reduce your sodium intake to 1,500 mg a day.  Limit alcohol intake to no more than 1 drink a day for nonpregnant women and 2 drinks a day for men. One drink equals 12 oz of beer, 5 oz of wine, or 1 oz of hard liquor.  Work with your health care provider to maintain a healthy body weight or to lose weight. Ask what an ideal weight is for you.  Get at least 30 minutes of exercise that causes your heart to beat faster (aerobic exercise) most days of the week. Activities may include walking, swimming, or biking.  Work with your health care provider or diet and nutrition specialist (dietitian) to adjust your eating plan to your individual calorie needs. Reading food labels  Check food labels for the amount of sodium per serving. Choose foods with less than 5 percent of the Daily Value of sodium. Generally, foods with less than 300 mg of sodium per serving fit into this eating plan.  To find whole grains, look for the word "whole" as the first word in the ingredient list. Shopping  Buy products labeled as "low-sodium" or "no salt added."  Buy fresh foods. Avoid canned foods and premade or frozen meals. Cooking  Avoid adding salt when cooking. Use salt-free seasonings or herbs instead of table salt or sea salt. Check with your health care provider or pharmacist before using salt substitutes.  Do not fry foods. Cook foods using healthy methods such as baking, boiling, grilling, and broiling instead.  Cook with  heart-healthy oils, such as olive, canola, soybean, or sunflower oil. Meal planning   Eat a balanced diet that includes: ? 5 or more servings of fruits and vegetables each day. At each meal, try to fill half of your plate with fruits and vegetables. ? Up to 6-8 servings of whole grains each day. ? Less than 6 oz of lean meat, poultry, or fish each day. A 3-oz serving of meat is about the same size as a deck of cards. One egg equals 1 oz. ? 2 servings of low-fat dairy each day. ? A serving of nuts, seeds, or beans 5 times each week. ? Heart-healthy fats. Healthy fats called Omega-3 fatty acids are found in foods such as flaxseeds and coldwater fish, like sardines, salmon, and mackerel.  Limit how much you eat of the following: ? Canned or prepackaged foods. ? Food that is high in trans fat, such as fried foods. ? Food that is high in saturated fat, such as fatty meat. ? Sweets, desserts, sugary drinks, and other foods with added sugar. ? Full-fat dairy products.  Do not salt foods before eating.  Try to eat at least 2 vegetarian meals each week.  Eat more home-cooked food and less restaurant, buffet, and fast food.  When eating at a restaurant, ask that your food be prepared with less salt or no salt, if possible. What foods are recommended? The items listed may not be a complete list. Talk with your dietitian about what   dietary choices are best for you. Grains Whole-grain or whole-wheat bread. Whole-grain or whole-wheat pasta. Brown rice. Oatmeal. Quinoa. Bulgur. Whole-grain and low-sodium cereals. Pita bread. Low-fat, low-sodium crackers. Whole-wheat flour tortillas. Vegetables Fresh or frozen vegetables (raw, steamed, roasted, or grilled). Low-sodium or reduced-sodium tomato and vegetable juice. Low-sodium or reduced-sodium tomato sauce and tomato paste. Low-sodium or reduced-sodium canned vegetables. Fruits All fresh, dried, or frozen fruit. Canned fruit in natural juice (without  added sugar). Meat and other protein foods Skinless chicken or turkey. Ground chicken or turkey. Pork with fat trimmed off. Fish and seafood. Egg whites. Dried beans, peas, or lentils. Unsalted nuts, nut butters, and seeds. Unsalted canned beans. Lean cuts of beef with fat trimmed off. Low-sodium, lean deli meat. Dairy Low-fat (1%) or fat-free (skim) milk. Fat-free, low-fat, or reduced-fat cheeses. Nonfat, low-sodium ricotta or cottage cheese. Low-fat or nonfat yogurt. Low-fat, low-sodium cheese. Fats and oils Soft margarine without trans fats. Vegetable oil. Low-fat, reduced-fat, or light mayonnaise and salad dressings (reduced-sodium). Canola, safflower, olive, soybean, and sunflower oils. Avocado. Seasoning and other foods Herbs. Spices. Seasoning mixes without salt. Unsalted popcorn and pretzels. Fat-free sweets. What foods are not recommended? The items listed may not be a complete list. Talk with your dietitian about what dietary choices are best for you. Grains Baked goods made with fat, such as croissants, muffins, or some breads. Dry pasta or rice meal packs. Vegetables Creamed or fried vegetables. Vegetables in a cheese sauce. Regular canned vegetables (not low-sodium or reduced-sodium). Regular canned tomato sauce and paste (not low-sodium or reduced-sodium). Regular tomato and vegetable juice (not low-sodium or reduced-sodium). Pickles. Olives. Fruits Canned fruit in a light or heavy syrup. Fried fruit. Fruit in cream or butter sauce. Meat and other protein foods Fatty cuts of meat. Ribs. Fried meat. Bacon. Sausage. Bologna and other processed lunch meats. Salami. Fatback. Hotdogs. Bratwurst. Salted nuts and seeds. Canned beans with added salt. Canned or smoked fish. Whole eggs or egg yolks. Chicken or turkey with skin. Dairy Whole or 2% milk, cream, and half-and-half. Whole or full-fat cream cheese. Whole-fat or sweetened yogurt. Full-fat cheese. Nondairy creamers. Whipped toppings.  Processed cheese and cheese spreads. Fats and oils Butter. Stick margarine. Lard. Shortening. Ghee. Bacon fat. Tropical oils, such as coconut, palm kernel, or palm oil. Seasoning and other foods Salted popcorn and pretzels. Onion salt, garlic salt, seasoned salt, table salt, and sea salt. Worcestershire sauce. Tartar sauce. Barbecue sauce. Teriyaki sauce. Soy sauce, including reduced-sodium. Steak sauce. Canned and packaged gravies. Fish sauce. Oyster sauce. Cocktail sauce. Horseradish that you find on the shelf. Ketchup. Mustard. Meat flavorings and tenderizers. Bouillon cubes. Hot sauce and Tabasco sauce. Premade or packaged marinades. Premade or packaged taco seasonings. Relishes. Regular salad dressings. Where to find more information:  National Heart, Lung, and Blood Institute: www.nhlbi.nih.gov  American Heart Association: www.heart.org Summary  The DASH eating plan is a healthy eating plan that has been shown to reduce high blood pressure (hypertension). It may also reduce your risk for type 2 diabetes, heart disease, and stroke.  With the DASH eating plan, you should limit salt (sodium) intake to 2,300 mg a day. If you have hypertension, you may need to reduce your sodium intake to 1,500 mg a day.  When on the DASH eating plan, aim to eat more fresh fruits and vegetables, whole grains, lean proteins, low-fat dairy, and heart-healthy fats.  Work with your health care provider or diet and nutrition specialist (dietitian) to adjust your eating plan to your individual   calorie needs. This information is not intended to replace advice given to you by your health care provider. Make sure you discuss any questions you have with your health care provider. Document Released: 10/14/2011 Document Revised: 10/18/2016 Document Reviewed: 10/18/2016 Elsevier Interactive Patient Education  2018 Else[employer] Inc.  

## 2018-05-05 LAB — BASIC METABOLIC PANEL
BUN / CREAT RATIO: 20 (ref 12–28)
BUN: 19 mg/dL (ref 8–27)
CO2: 26 mmol/L (ref 20–29)
CREATININE: 0.95 mg/dL (ref 0.57–1.00)
Calcium: 9.3 mg/dL (ref 8.7–10.3)
Chloride: 104 mmol/L (ref 96–106)
GFR, EST AFRICAN AMERICAN: 73 mL/min/{1.73_m2} (ref >59)
GFR, EST NON AFRICAN AMERICAN: 63 mL/min/{1.73_m2} (ref >59)
Glucose: 95 mg/dL (ref 65–99)
POTASSIUM: 3.9 mmol/L (ref 3.5–5.2)
SODIUM: 142 mmol/L (ref 134–144)

## 2018-05-05 LAB — LIPID PANEL
CHOL/HDL RATIO: 2.6 ratio (ref 0.0–4.4)
Cholesterol, Total: 149 mg/dL (ref 100–199)
HDL: 58 mg/dL (ref >39)
LDL CALC: 75 mg/dL (ref 0–99)
Triglycerides: 79 mg/dL (ref 0–149)
VLDL Cholesterol Cal: 16 mg/dL (ref 5–40)

## 2018-06-02 ENCOUNTER — Telehealth: Payer: Self-pay | Admitting: Family Medicine

## 2018-06-02 NOTE — Telephone Encounter (Signed)
[place] [facility] for [facility]; states that diagnosis stated for client is not covered by her current insurance; needs to speak with someone regarding diagnosis code before treatment may be completed

## 2018-06-02 NOTE — Telephone Encounter (Signed)
Armeniahina, Is there any other dx code you can use for her bone density? Please advise. Thanks!

## 2018-07-03 ENCOUNTER — Other Ambulatory Visit: Payer: Self-pay | Admitting: Family Medicine

## 2018-07-03 DIAGNOSIS — E2839 Other primary ovarian failure: Secondary | ICD-10-CM

## 2018-07-03 NOTE — Progress Notes (Signed)
Bone density ordered.

## 2018-07-11 ENCOUNTER — Other Ambulatory Visit: Payer: Self-pay | Admitting: Family Medicine

## 2018-07-11 DIAGNOSIS — I10 Essential (primary) hypertension: Secondary | ICD-10-CM

## 2018-07-11 MED FILL — LISINOPRIL-HCTZ 20-12.5 TAB: 20-12.5 | 90 days supply | Qty: 90 | Fill #0

## 2018-07-12 ENCOUNTER — Other Ambulatory Visit: Payer: Self-pay | Admitting: Family Medicine

## 2018-07-12 DIAGNOSIS — E7800 Pure hypercholesterolemia, unspecified: Secondary | ICD-10-CM

## 2018-08-08 MED FILL — SIMVASTATIN 20 MG TABLET: 20 | 90 days supply | Qty: 90 | Fill #0

## 2018-08-25 ENCOUNTER — Ambulatory Visit
Admission: RE | Admit: 2018-08-25 | Discharge: 2018-08-25 | Disposition: A | Discharge status: Home / Self Care | Payer: Medicare Other | Source: Ambulatory Visit | Attending: Family Medicine | Admitting: Family Medicine

## 2018-08-25 DIAGNOSIS — E2839 Other primary ovarian failure: Secondary | ICD-10-CM

## 2018-09-28 ENCOUNTER — Other Ambulatory Visit: Payer: Self-pay

## 2018-09-28 ENCOUNTER — Other Ambulatory Visit: Payer: Self-pay | Admitting: Family Medicine

## 2018-09-28 ENCOUNTER — Telehealth: Payer: Self-pay

## 2018-09-28 DIAGNOSIS — M8580 Other specified disorders of bone density and structure, unspecified site: Secondary | ICD-10-CM

## 2018-09-28 MED ORDER — ALENDRONATE SODIUM 70 MG PO TABS: 70.0000 mg | ORAL_TABLET | ORAL | 11 refills | Status: DC

## 2018-09-28 MED FILL — ALENDRONATE NA 70 MG TAB: 70 | 28 days supply | Qty: 4 | Fill #0

## 2018-09-28 NOTE — Progress Notes (Signed)
Patient with osteopenia on Dexa scan. Starting on Fosamax weekly.

## 2018-09-28 NOTE — Progress Notes (Signed)
Patient with osteopenia on Dexa scan. This means that she is at risk for developing osteoporosis due to thinning of the bones. I recommend weight bearing exercises to help with this. I am starting you on a medications called Fosamax that you will take weekly. We will repeat the dexa scan in approx. 2years to monitor.

## 2018-09-28 NOTE — Telephone Encounter (Signed)
-----   Message from Mike GipAndre Douglas, FNP sent at 09/28/2018  2:38 PM EST ----- Patient with osteopenia on Dexa scan. This means that she is at risk for developing osteoporosis due to thinning of the bones. I recommend weight bearing exercises to help with this. I am starting you on a medications called Fosamax that you will take weekly. We will repeat the dexa scan in approx. 2years to monitor.

## 2018-09-28 NOTE — Telephone Encounter (Signed)
Called, spoke with patient, advised that osteopenia is present on dexa scan and we will start her on fosamax once weekly. Advised that we will repeat dexa scan in 2 years. Patient verbalized understanding. Thanks!

## 2018-10-19 MED FILL — LISINOPRIL-HCTZ 20-12.5 MG: 20-12.5 | 90 days supply | Qty: 90 | Fill #1

## 2018-11-06 MED FILL — ALENDRONATE NA 70 MG TAB: 70 | 84 days supply | Qty: 12 | Fill #1

## 2018-11-06 MED FILL — SIMVASTATIN 20 MG TABLET: 20 | 90 days supply | Qty: 90 | Fill #1

## 2018-11-15 ENCOUNTER — Ambulatory Visit: Payer: Medicare Other | Admitting: Family Medicine

## 2019-01-29 ENCOUNTER — Other Ambulatory Visit: Payer: Self-pay | Admitting: Family Medicine

## 2019-01-29 DIAGNOSIS — Z1231 Encounter for screening mammogram for malignant neoplasm of breast: Secondary | ICD-10-CM

## 2019-02-05 ENCOUNTER — Ambulatory Visit (INDEPENDENT_AMBULATORY_CARE_PROVIDER_SITE_OTHER): Payer: Medicare Other | Admitting: Family Medicine

## 2019-02-05 ENCOUNTER — Other Ambulatory Visit: Payer: Self-pay

## 2019-02-05 DIAGNOSIS — I10 Essential (primary) hypertension: Secondary | ICD-10-CM

## 2019-02-05 DIAGNOSIS — M8580 Other specified disorders of bone density and structure, unspecified site: Secondary | ICD-10-CM

## 2019-02-05 DIAGNOSIS — E7800 Pure hypercholesterolemia, unspecified: Secondary | ICD-10-CM

## 2019-02-05 MED ORDER — SIMVASTATIN 20 MG PO TABS: 20.0000 mg | ORAL_TABLET | Freq: Every day | ORAL | 1 refills | Status: DC

## 2019-02-05 MED ORDER — LISINOPRIL-HYDROCHLOROTHIAZIDE 20-12.5 MG PO TABS: 1.0000 | ORAL_TABLET | Freq: Every day | ORAL | 1 refills | Status: DC

## 2019-02-05 MED ORDER — ALENDRONATE SODIUM 70 MG PO TABS: 70.0000 mg | ORAL_TABLET | ORAL | 3 refills | Status: AC

## 2019-02-05 MED FILL — SIMVASTATIN 20 MG TABLET: 20 | 90 days supply | Qty: 90 | Fill #0

## 2019-02-05 MED FILL — LISINOPRIL-HCTZ 20-12.5 MG: 20-12.5 | 90 days supply | Qty: 90 | Fill #0

## 2019-02-05 MED FILL — ALENDRONATE NA 70 MG TAB: 70 | 84 days supply | Qty: 12 | Fill #0

## 2019-02-05 NOTE — Progress Notes (Signed)
  [facility]  Virtual Visit via Telephone Note  I connected with Angela [name] on 02/05/19 at  8:40 AM EDT by telephone and verified that I am speaking with the correct person using two identifiers.   I discussed the limitations, risks, security and privacy concerns of performing an evaluation and management service by telephone and the availability of in person appointments. I also discussed with the patient that there may be a patient responsible charge related to this service. The patient expressed understanding and agreed to proceed.   History of Present Illness: Angela [name]  has a past medical history of Anemia, Hyperlipidemia, Hypertension, and TIA (transient ischemic attack) (2004). She states that she is doing well on her medications and reports compliance with them. She denies side effects at the present time.     Observations/Objective: Patient with regular voice tone, rate and rhythm. Speaking calmly and is in no apparent distress.     Assessment and Plan: 1. Essential hypertension - lisinopril-hydrochlorothiazide (PRINZIDE,ZESTORETIC) 20-12.5 MG tablet; Take 1 tablet by mouth daily.  Dispense: 90 tablet; Refill: 1  2. Pure hypercholesterolemia - simvastatin (ZOCOR) 20 MG tablet; Take 1 tablet (20 mg total) by mouth at bedtime.  Dispense: 90 tablet; Refill: 1  3. Osteopenia, unspecified location - alendronate (FOSAMAX) 70 MG tablet; Take 1 tablet (70 mg total) by mouth every 7 (seven) days. Take with a full glass of water on an empty stomach.  Dispense: 12 tablet; Refill: 3   Follow Up Instructions:   We discussed hand washing, using hand sanitizer when soap and water are not available, only going out when absolutely necessary, and social distancing. Explained to patient that she is immunocompromised and will need to take precautions during this time.   I discussed the assessment and treatment plan with the patient. The  patient was provided an opportunity to ask questions and all were answered. The patient agreed with the plan and demonstrated an understanding of the instructions.   The patient was advised to call back or seek an in-person evaluation if the symptoms worsen or if the condition fails to improve as anticipated.  I provided 10 minutes of non-face-to-face time during this encounter.  [name], FNP-BC [facility] [address] [place], [place] [address] [phone]

## 2019-03-08 ENCOUNTER — Ambulatory Visit: Payer: Medicare Other

## 2019-04-19 ENCOUNTER — Ambulatory Visit
Admission: RE | Admit: 2019-04-19 | Discharge: 2019-04-19 | Disposition: A | Discharge status: Home / Self Care | Payer: Medicare Other | Source: Ambulatory Visit | Attending: Family Medicine | Admitting: Family Medicine

## 2019-04-19 ENCOUNTER — Other Ambulatory Visit: Payer: Self-pay

## 2019-04-19 DIAGNOSIS — Z1231 Encounter for screening mammogram for malignant neoplasm of breast: Secondary | ICD-10-CM

## 2019-05-27 MED FILL — LISINOPRIL-HCTZ 20-12.5 MG: 20-12.5 | 90 days supply | Qty: 90 | Fill #1

## 2019-05-28 MED FILL — ALENDRONATE NA 70 MG TAB: 70 | 84 days supply | Qty: 12 | Fill #1

## 2019-07-02 MED FILL — SIMVASTATIN 20 MG TABLET: 20 | 90 days supply | Qty: 90 | Fill #1

## 2019-07-12 MED FILL — SIMVASTATIN 20 MG TABLET: 20 | 90 days supply | Qty: 90 | Fill #1

## 2019-07-18 ENCOUNTER — Encounter (HOSPITAL_COMMUNITY): Payer: Self-pay

## 2019-07-18 ENCOUNTER — Encounter (HOSPITAL_COMMUNITY): Payer: Self-pay | Admitting: *Deleted

## 2019-09-04 ENCOUNTER — Telehealth: Payer: Self-pay | Admitting: Internal Medicine

## 2019-09-04 ENCOUNTER — Other Ambulatory Visit: Payer: Self-pay | Admitting: Internal Medicine

## 2019-09-04 DIAGNOSIS — I10 Essential (primary) hypertension: Secondary | ICD-10-CM

## 2019-09-04 MED ORDER — LISINOPRIL-HYDROCHLOROTHIAZIDE 20-12.5 MG PO TABS: 1.0000 | ORAL_TABLET | Freq: Every day | ORAL | 1 refills | Status: DC

## 2019-09-04 NOTE — Telephone Encounter (Signed)
Refilled

## 2019-09-07 ENCOUNTER — Other Ambulatory Visit: Payer: Self-pay | Admitting: Family Medicine

## 2019-09-07 DIAGNOSIS — I10 Essential (primary) hypertension: Secondary | ICD-10-CM

## 2019-09-07 MED FILL — LISINOPRIL-HCTZ 20-12.5 MG: 20-12.5 | 90 days supply | Qty: 90 | Fill #0

## 2019-10-22 ENCOUNTER — Other Ambulatory Visit: Payer: Self-pay | Admitting: Family Medicine

## 2019-10-22 DIAGNOSIS — E7800 Pure hypercholesterolemia, unspecified: Secondary | ICD-10-CM

## 2019-10-22 MED FILL — ALENDRONATE NA 70 MG TAB: 70 | 84 days supply | Qty: 12 | Fill #2

## 2019-10-22 MED FILL — SIMVASTATIN 20 MG TABLET: 20 | 90 days supply | Qty: 90 | Fill #0

## 2019-12-25 DIAGNOSIS — H2511 Age-related nuclear cataract, right eye: Secondary | ICD-10-CM | POA: Diagnosis not present

## 2019-12-25 DIAGNOSIS — H25043 Posterior subcapsular polar age-related cataract, bilateral: Secondary | ICD-10-CM | POA: Diagnosis not present

## 2019-12-25 DIAGNOSIS — H2513 Age-related nuclear cataract, bilateral: Secondary | ICD-10-CM | POA: Diagnosis not present

## 2019-12-25 DIAGNOSIS — H18413 Arcus senilis, bilateral: Secondary | ICD-10-CM | POA: Diagnosis not present

## 2019-12-25 DIAGNOSIS — H25013 Cortical age-related cataract, bilateral: Secondary | ICD-10-CM | POA: Diagnosis not present

## 2019-12-27 MED FILL — PROLENSA 0.07% EYE DROPS: 0.07 | 60 days supply | Qty: 3 | Fill #0

## 2020-01-07 DIAGNOSIS — H2511 Age-related nuclear cataract, right eye: Secondary | ICD-10-CM | POA: Diagnosis not present

## 2020-01-07 HISTORY — PX: CATARACT EXTRACTION, BILATERAL: SHX1313

## 2020-01-08 DIAGNOSIS — H2512 Age-related nuclear cataract, left eye: Secondary | ICD-10-CM | POA: Diagnosis not present

## 2020-01-21 MED FILL — LISINOPRIL-HCTZ 20-12.5 MG: 20-12.5 | 90 days supply | Qty: 90 | Fill #1

## 2020-01-28 DIAGNOSIS — H2512 Age-related nuclear cataract, left eye: Secondary | ICD-10-CM | POA: Diagnosis not present

## 2020-03-03 MED FILL — SIMVASTATIN 20 MG TABLET: 20 | 90 days supply | Qty: 90 | Fill #1

## 2020-04-11 ENCOUNTER — Encounter: Payer: Self-pay | Admitting: Nurse Practitioner

## 2020-04-11 ENCOUNTER — Ambulatory Visit (INDEPENDENT_AMBULATORY_CARE_PROVIDER_SITE_OTHER): Payer: Medicare Other | Admitting: Nurse Practitioner

## 2020-04-11 ENCOUNTER — Other Ambulatory Visit: Payer: Self-pay

## 2020-04-11 VITALS — BP 126/60 | HR 67 | Temp 97.9°F | Ht 62.0 in | Wt 104.0 lb

## 2020-04-11 DIAGNOSIS — E782 Mixed hyperlipidemia: Secondary | ICD-10-CM

## 2020-04-11 DIAGNOSIS — Z0000 Encounter for general adult medical examination without abnormal findings: Secondary | ICD-10-CM | POA: Diagnosis not present

## 2020-04-11 DIAGNOSIS — I10 Essential (primary) hypertension: Secondary | ICD-10-CM | POA: Diagnosis not present

## 2020-04-11 DIAGNOSIS — E611 Iron deficiency: Secondary | ICD-10-CM

## 2020-04-11 LAB — POCT URINALYSIS DIPSTICK
Bilirubin, UA: NEGATIVE
Blood, UA: NEGATIVE
Glucose, UA: NEGATIVE
Ketones, UA: NEGATIVE
Leukocytes, UA: NEGATIVE
Nitrite, UA: NEGATIVE
Protein, UA: NEGATIVE
Spec Grav, UA: 1.025 (ref 1.010–1.025)
Urobilinogen, UA: 0.2 E.U./dL
pH, UA: 6 (ref 5.0–8.0)

## 2020-04-11 NOTE — Patient Instructions (Signed)
   Managing Your Hypertension Hypertension is commonly called high blood pressure. This is when the force of your blood pressing against the walls of your arteries is too strong. Arteries are blood vessels that carry blood from your heart throughout your body. Hypertension forces the heart to work harder to pump blood, and may cause the arteries to become narrow or stiff. Having untreated or uncontrolled hypertension can cause heart attack, stroke, kidney disease, and other problems. What are blood pressure readings? A blood pressure reading consists of a higher number over a lower number. Ideally, your blood pressure should be below 120/80. The first ("top") number is called the systolic pressure. It is a measure of the pressure in your arteries as your heart beats. The second ("bottom") number is called the diastolic pressure. It is a measure of the pressure in your arteries as the heart relaxes. What does my blood pressure reading mean? Blood pressure is classified into four stages. Based on your blood pressure reading, your health care provider may use the following stages to determine what type of treatment you need, if any. Systolic pressure and diastolic pressure are measured in a unit called mm Hg. Normal  Systolic pressure: below 120.  Diastolic pressure: below 80. Elevated  Systolic pressure: 120-129.  Diastolic pressure: below 80. Hypertension stage 1  Systolic pressure: 130-139.  Diastolic pressure: 80-89. Hypertension stage 2  Systolic pressure: 140 or above.  Diastolic pressure: 90 or above. What health risks are associated with hypertension? Managing your hypertension is an important responsibility. Uncontrolled hypertension can lead to:  A heart attack.  A stroke.  A weakened blood vessel (aneurysm).  Heart failure.  Kidney damage.  Eye damage.  Metabolic syndrome.  Memory and concentration problems. What changes can I make to manage my  hypertension? Hypertension can be managed by making lifestyle changes and possibly by taking medicines. Your health care provider will help you make a plan to bring your blood pressure within a normal range. Eating and drinking   Eat a diet that is high in fiber and potassium, and low in salt (sodium), added sugar, and fat. An example eating plan is called the DASH (Dietary Approaches to Stop Hypertension) diet. To eat this way: ? Eat plenty of fresh fruits and vegetables. Try to fill half of your plate at each meal with fruits and vegetables. ? Eat whole grains, such as whole wheat pasta, brown rice, or whole grain bread. Fill about one quarter of your plate with whole grains. ? Eat low-fat diary products. ? Avoid fatty cuts of meat, processed or cured meats, and poultry with skin. Fill about one quarter of your plate with lean proteins such as fish, chicken without skin, beans, eggs, and tofu. ? Avoid premade and processed foods. These tend to be higher in sodium, added sugar, and fat.  Reduce your daily sodium intake. Most people with hypertension should eat less than 1,500 mg of sodium a day.  Limit alcohol intake to no more than 1 drink a day for nonpregnant women and 2 drinks a day for men. One drink equals 12 oz of beer, 5 oz of wine, or 1 oz of hard liquor. Lifestyle  Work with your health care provider to maintain a healthy body weight, or to lose weight. Ask what an ideal weight is for you.  Get at least 30 minutes of exercise that causes your heart to beat faster (aerobic exercise) most days of the week. Activities may include walking, swimming, or biking.    Include exercise to strengthen your muscles (resistance exercise), such as weight lifting, as part of your weekly exercise routine. Try to do these types of exercises for 30 minutes at least 3 days a week.  Do not use any products that contain nicotine or tobacco, such as cigarettes and e-cigarettes. If you need help quitting,  ask your health care provider.  Control any long-term (chronic) conditions you have, such as high cholesterol or diabetes. Monitoring  Monitor your blood pressure at home as told by your health care provider. Your personal target blood pressure may vary depending on your medical conditions, your age, and other factors.  Have your blood pressure checked regularly, as often as told by your health care provider. Working with your health care provider  Review all the medicines you take with your health care provider because there may be side effects or interactions.  Talk with your health care provider about your diet, exercise habits, and other lifestyle factors that may be contributing to hypertension.  Visit your health care provider regularly. Your health care provider can help you create and adjust your plan for managing hypertension. Will I need medicine to control my blood pressure? Your health care provider may prescribe medicine if lifestyle changes are not enough to get your blood pressure under control, and if:  Your systolic blood pressure is 130 or higher.  Your diastolic blood pressure is 80 or higher. Take medicines only as told by your health care provider. Follow the directions carefully. Blood pressure medicines must be taken as prescribed. The medicine does not work as well when you skip doses. Skipping doses also puts you at risk for problems. Contact a health care provider if:  You think you are having a reaction to medicines you have taken.  You have repeated (recurrent) headaches.  You feel dizzy.  You have swelling in your ankles.  You have trouble with your vision. Get help right away if:  You develop a severe headache or confusion.  You have unusual weakness or numbness, or you feel faint.  You have severe pain in your chest or abdomen.  You vomit repeatedly.  You have trouble breathing. Summary  Hypertension is when the force of blood pumping  through your arteries is too strong. If this condition is not controlled, it may put you at risk for serious complications.  Your personal target blood pressure may vary depending on your medical conditions, your age, and other factors. For most people, a normal blood pressure is less than 120/80.  Hypertension is managed by lifestyle changes, medicines, or both. Lifestyle changes include weight loss, eating a healthy, low-sodium diet, exercising more, and limiting alcohol. This information is not intended to replace advice given to you by your health care provider. Make sure you discuss any questions you have with your health care provider. Document Revised: 02/16/2019 Document Reviewed: 09/22/2016 Elsevier Patient Education  2020 [employer] Inc.  

## 2020-04-11 NOTE — Progress Notes (Signed)
[place], [place]  [address] Phone:  [phone]   Fax:  [phone]   Established Patient Office Visit  Subjective:  Patient ID: Angela [name], female    DOB: 24-Feb-1951  Age: 69 y.o. MRN: [id number]  CC:  Chief Complaint  Patient presents with   New Patient (Initial Visit)     establish care     HPI [facility] presents for follow up. She has been lost to follow up. She  has a past medical history of Anemia, Hyperlipidemia, Hypertension, and TIA (transient ischemic attack) (2004).   Hypertension Patient is here for follow-up of elevated blood pressure. She is exercising and is adherent to a low-salt diet. Blood pressure is well controlled at home. Cardiac symptoms: none. Patient denies chest pain, dyspnea, fatigue, irregular heart beat, near-syncope and syncope. Cardiovascular risk factors: advanced age (older than 77 for men, 88 for women) and dyslipidemia. Use of agents associated with hypertension: none. History of target organ damage: transient ischemic attack.  Past Medical History:  Diagnosis Date   Anemia    Hyperlipidemia    Hypertension    TIA (transient ischemic attack) 2004    Past Surgical History:  Procedure Laterality Date   CATARACT EXTRACTION, BILATERAL  01/2020   neg hx      Family History  Problem Relation Age of Onset   Cancer Mother 63       gallbladder   Hypertension Mother    Heart disease Father    Hypertension Father    Hypertension Brother    Colon cancer Neg Hx     Social History   Socioeconomic History   Marital status: Single    Spouse name: Not on file   Number of children: 1   Years of education: Not on file   Highest education level: Not on file  Occupational History   Occupation: Probation officer  Tobacco Use   Smoking status: Never Smoker   Smokeless tobacco: Never Used  Substance and Sexual Activity   Alcohol use: No    Alcohol/week: 0.0 standard  drinks   Drug use: No   Sexual activity: Yes    Birth control/protection: Condom  Other Topics Concern   Not on file  Social History Narrative   Single. Formerly worked in Charity fundraiser. Has one grown child and 2 grandsons. 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    Social Determinants of Health   Financial Resource Strain:    Difficulty of Paying Living Expenses:   Food Insecurity:    Worried About Charity fundraiser in the Last Year:    Arboriculturist in the Last Year:   Transportation Needs:    Film/video editor (Medical):    Lack of Transportation (Non-Medical):   Physical Activity:    Days of Exercise per Week:    Minutes of Exercise per Session:   Stress:    Feeling of Stress :   Social Connections:    Frequency of Communication with Friends and Family:    Frequency of Social Gatherings with Friends and Family:    Attends Religious Services:    Active Member of Clubs or Organizations:    Attends Archivist Meetings:    Marital Status:   Intimate Partner Violence:    Fear of Current or Ex-Partner:    Emotionally Abused:    Physically Abused:    Sexually Abused:     Outpatient Medications Prior to Visit  Medication Sig Dispense Refill   alendronate (FOSAMAX) 70 MG tablet Take 70 mg by mouth once a week. Take with a full glass of water on an empty stomach.     aspirin 325 MG tablet Take 1 tablet (325 mg total) by mouth daily. 30 tablet 11   calcium carbonate (OS-CAL) 600 MG TABS Take 600 mg by mouth 2 (two) times daily with a meal.     Ferrous Sulfate (IRON) 325 (65 Fe) MG TABS Take 1 tablet (325 mg total) by mouth daily. (Patient taking differently: Take 1 tablet by mouth daily. Pt is taking 1 three times a day) 90 each 11   lisinopril-hydrochlorothiazide (ZESTORETIC) 20-12.5 MG tablet TAKE 1 TABLET BY MOUTH DAILY. 90 tablet 1   Multiple Vitamin (MULTIVITAMIN) tablet Take 1 tablet by mouth daily.     simvastatin (ZOCOR) 20  MG tablet TAKE 1 TABLET (20 MG TOTAL) BY MOUTH AT BEDTIME. 90 tablet 1   No facility-administered medications prior to visit.    No Known Allergies  ROS Review of Systems  All other systems reviewed and are negative.     Objective:    Physical Exam  Constitutional: She is oriented to person, place, and time. She appears well-developed and well-nourished. No distress.  HENT:  Head: Normocephalic and atraumatic.  Eyes: Pupils are equal, round, and reactive to light.  Cardiovascular: Normal rate, regular rhythm, normal heart sounds and intact distal pulses.  Pulmonary/Chest: Effort normal and breath sounds normal.  Abdominal: Soft. Bowel sounds are normal.  Musculoskeletal:        General: Normal range of motion.     Cervical back: Normal range of motion and neck supple.  Neurological: She is alert and oriented to person, place, and time.  Skin: Skin is warm and dry.  Psychiatric: She has a normal mood and affect. Her behavior is normal. Judgment and thought content normal.    BP 126/60 (BP Location: Left Arm, Patient Position: Sitting)    Pulse 67    Temp 97.9 F (36.6 C) (Temporal)    Ht 5\' 2"  (1.575 m)    Wt 104 lb (47.2 kg)    SpO2 100%    BMI 19.02 kg/m  Wt Readings from Last 3 Encounters:  04/11/20 104 lb (47.2 kg)  05/04/18 104 lb (47.2 kg)  02/01/18 108 lb (49 kg)     Health Maintenance Due  Topic Date Due   PNA vac Low Risk Adult (2 of 2 - PPSV23) 01/04/2019    There are no preventive care reminders to display for this patient.  Lab Results  Component Value Date   TSH 2.575 06/03/2015   Lab Results  Component Value Date   WBC 3.6 01/04/2018   HGB 11.4 01/04/2018   HCT 34.1 01/04/2018   MCV 88 01/04/2018   PLT 220 01/04/2018   Lab Results  Component Value Date   NA 142 05/04/2018   K 3.9 05/04/2018   CO2 26 05/04/2018   GLUCOSE 95 05/04/2018   BUN 19 05/04/2018   CREATININE 0.95 05/04/2018   BILITOT 0.3 12/20/2016   ALKPHOS 76 12/20/2016    AST 30 12/20/2016   ALT 18 12/20/2016   PROT 7.3 12/20/2016   ALBUMIN 3.9 12/20/2016   CALCIUM 9.3 05/04/2018   Lab Results  Component Value Date   CHOL 149 05/04/2018   Lab Results  Component Value Date   HDL 58 05/04/2018   Lab Results  Component Value Date   LDLCALC 75 05/04/2018  Lab Results  Component Value Date   TRIG 79 05/04/2018   Lab Results  Component Value Date   CHOLHDL 2.6 05/04/2018   Lab Results  Component Value Date   HGBA1C 5.6 01/04/2018      Assessment & Plan:   Problem List Items Addressed This Visit      Cardiovascular and Mediastinum   Essential hypertension (Chronic)   Relevant Orders   Comp. Metabolic Panel (12)     Other   Hyperlipidemia (Chronic)   Relevant Orders   Lipid panel   Iron deficiency (Chronic)   Relevant Orders   Iron, TIBC and Ferritin Panel   CBC with Differential/Platelet    Other Visit Diagnoses    Healthcare maintenance    -  Primary   Relevant Orders   POCT Urinalysis Dipstick (Completed)   MM 3D SCREEN BREAST BILATERAL      No orders of the defined types were placed in this encounter.   Follow-up: Return in about 6 months (around 10/11/2020) for medical record release [facility] for colonscopy information, No follow up needed.    Barbette Merino, NP

## 2020-04-12 LAB — CBC WITH DIFFERENTIAL/PLATELET
Basophils Absolute: 0 10*3/uL (ref 0.0–0.2)
Basos: 1 %
EOS (ABSOLUTE): 0.1 10*3/uL (ref 0.0–0.4)
Eos: 3 %
Hematocrit: 37.4 % (ref 34.0–46.6)
Hemoglobin: 12.4 g/dL (ref 11.1–15.9)
Immature Grans (Abs): 0 10*3/uL (ref 0.0–0.1)
Immature Granulocytes: 0 %
Lymphocytes Absolute: 1.9 10*3/uL (ref 0.7–3.1)
Lymphs: 36 %
MCH: 29.7 pg (ref 26.6–33.0)
MCHC: 33.2 g/dL (ref 31.5–35.7)
MCV: 90 fL (ref 79–97)
Monocytes Absolute: 0.5 10*3/uL (ref 0.1–0.9)
Monocytes: 10 %
Neutrophils Absolute: 2.7 10*3/uL (ref 1.4–7.0)
Neutrophils: 50 %
Platelets: 220 10*3/uL (ref 150–450)
RBC: 4.17 x10E6/uL (ref 3.77–5.28)
RDW: 13 % (ref 11.7–15.4)
WBC: 5.3 10*3/uL (ref 3.4–10.8)

## 2020-04-12 LAB — LIPID PANEL
Chol/HDL Ratio: 2.9 ratio (ref 0.0–4.4)
Cholesterol, Total: 163 mg/dL (ref 100–199)
HDL: 56 mg/dL (ref >39)
LDL Chol Calc (NIH): 89 mg/dL (ref 0–99)
Triglycerides: 100 mg/dL (ref 0–149)
VLDL Cholesterol Cal: 18 mg/dL (ref 5–40)

## 2020-04-12 LAB — COMP. METABOLIC PANEL (12)
AST: 24 IU/L (ref 0–40)
Albumin/Globulin Ratio: 1.4 (ref 1.2–2.2)
Albumin: 4.4 g/dL (ref 3.8–4.8)
Alkaline Phosphatase: 89 IU/L (ref 48–121)
BUN/Creatinine Ratio: 21 (ref 12–28)
BUN: 17 mg/dL (ref 8–27)
Bilirubin Total: 0.2 mg/dL (ref 0.0–1.2)
Calcium: 9.6 mg/dL (ref 8.7–10.3)
Chloride: 102 mmol/L (ref 96–106)
Creatinine, Ser: 0.82 mg/dL (ref 0.57–1.00)
GFR calc Af Amer: 86 mL/min/{1.73_m2} (ref >59)
GFR calc non Af Amer: 74 mL/min/{1.73_m2} (ref >59)
Globulin, Total: 3.1 g/dL (ref 1.5–4.5)
Glucose: 104 mg/dL — ABNORMAL HIGH (ref 65–99)
Potassium: 4.1 mmol/L (ref 3.5–5.2)
Sodium: 140 mmol/L (ref 134–144)
Total Protein: 7.5 g/dL (ref 6.0–8.5)

## 2020-04-12 LAB — IRON,TIBC AND FERRITIN PANEL
Ferritin: 871 ng/mL — ABNORMAL HIGH (ref 15–150)
Iron Saturation: 18 % (ref 15–55)
Iron: 48 ug/dL (ref 27–139)
Total Iron Binding Capacity: 272 ug/dL (ref 250–450)
UIBC: 224 ug/dL (ref 118–369)

## 2020-04-21 ENCOUNTER — Ambulatory Visit
Admission: RE | Admit: 2020-04-21 | Discharge: 2020-04-21 | Disposition: A | Discharge status: Home / Self Care | Payer: Medicare Other | Source: Ambulatory Visit | Attending: Nurse Practitioner | Admitting: Nurse Practitioner

## 2020-04-21 ENCOUNTER — Other Ambulatory Visit: Payer: Self-pay

## 2020-04-21 DIAGNOSIS — Z0000 Encounter for general adult medical examination without abnormal findings: Secondary | ICD-10-CM

## 2020-04-21 DIAGNOSIS — Z1231 Encounter for screening mammogram for malignant neoplasm of breast: Secondary | ICD-10-CM | POA: Diagnosis not present

## 2020-05-06 ENCOUNTER — Other Ambulatory Visit: Payer: Self-pay | Admitting: Internal Medicine

## 2020-05-06 DIAGNOSIS — I10 Essential (primary) hypertension: Secondary | ICD-10-CM

## 2020-05-13 ENCOUNTER — Other Ambulatory Visit: Payer: Self-pay | Admitting: Nurse Practitioner

## 2020-05-13 DIAGNOSIS — I10 Essential (primary) hypertension: Secondary | ICD-10-CM

## 2020-05-13 MED ORDER — LISINOPRIL-HYDROCHLOROTHIAZIDE 20-12.5 MG PO TABS: 1.0000 | ORAL_TABLET | Freq: Every day | ORAL | 1 refills | Status: DC

## 2020-05-13 MED FILL — LISINOPRIL-HCTZ 20-12.5 MG: 20-12.5 | 90 days supply | Qty: 90 | Fill #0

## 2020-05-14 ENCOUNTER — Telehealth: Payer: Self-pay | Admitting: Nurse Practitioner

## 2020-05-14 NOTE — Telephone Encounter (Signed)
Sent on yesterday

## 2020-06-23 ENCOUNTER — Other Ambulatory Visit: Payer: Self-pay | Admitting: Internal Medicine

## 2020-06-23 DIAGNOSIS — E7800 Pure hypercholesterolemia, unspecified: Secondary | ICD-10-CM

## 2020-06-23 MED FILL — SIMVASTATIN 20 MG TABLET: 20 | 90 days supply | Qty: 90 | Fill #0

## 2020-06-24 ENCOUNTER — Other Ambulatory Visit: Payer: Self-pay | Admitting: Nurse Practitioner

## 2020-06-24 ENCOUNTER — Telehealth: Payer: Self-pay | Admitting: Nurse Practitioner

## 2020-06-24 DIAGNOSIS — E7800 Pure hypercholesterolemia, unspecified: Secondary | ICD-10-CM

## 2020-06-24 MED ORDER — SIMVASTATIN 20 MG PO TABS: 20.0000 mg | ORAL_TABLET | Freq: Every day | ORAL | 3 refills | Status: DC

## 2020-06-24 NOTE — Telephone Encounter (Signed)
Sent!

## 2020-08-19 MED FILL — LISINOPRIL-HCTZ 20-12.5 MG: 20-12.5 | 90 days supply | Qty: 90 | Fill #1

## 2020-10-10 ENCOUNTER — Other Ambulatory Visit: Payer: Self-pay

## 2020-10-10 ENCOUNTER — Ambulatory Visit (INDEPENDENT_AMBULATORY_CARE_PROVIDER_SITE_OTHER): Payer: Medicare Other | Admitting: Nurse Practitioner

## 2020-10-10 ENCOUNTER — Encounter: Payer: Self-pay | Admitting: Nurse Practitioner

## 2020-10-10 ENCOUNTER — Other Ambulatory Visit: Payer: Self-pay | Admitting: Nurse Practitioner

## 2020-10-10 VITALS — BP 147/70 | HR 67 | Temp 98.2°F | Resp 20 | Ht 62.5 in | Wt 102.0 lb

## 2020-10-10 DIAGNOSIS — M8580 Other specified disorders of bone density and structure, unspecified site: Secondary | ICD-10-CM

## 2020-10-10 DIAGNOSIS — E782 Mixed hyperlipidemia: Secondary | ICD-10-CM

## 2020-10-10 DIAGNOSIS — E611 Iron deficiency: Secondary | ICD-10-CM | POA: Diagnosis not present

## 2020-10-10 DIAGNOSIS — E7800 Pure hypercholesterolemia, unspecified: Secondary | ICD-10-CM

## 2020-10-10 DIAGNOSIS — I10 Essential (primary) hypertension: Secondary | ICD-10-CM

## 2020-10-10 MED ORDER — LISINOPRIL-HYDROCHLOROTHIAZIDE 20-12.5 MG PO TABS: 1.0000 | ORAL_TABLET | Freq: Every day | ORAL | 3 refills | Status: DC

## 2020-10-10 MED ORDER — ALENDRONATE SODIUM 70 MG PO TABS: 70.0000 mg | ORAL_TABLET | ORAL | 3 refills | Status: DC

## 2020-10-10 MED FILL — ALENDRONATE NA 70 MG TAB: 70 | 84 days supply | Qty: 12 | Fill #0

## 2020-10-10 NOTE — Patient Instructions (Signed)
Osteopenia  Osteopenia is a loss of thickness (density) inside of the bones. Another name for osteopenia is low bone mass. Mild osteopenia is a normal part of aging. It is not a disease, and it does not cause symptoms. However, if you have osteopenia and continue to lose bone mass, you could develop a condition that causes the bones to become thin and break more easily (osteoporosis). You may also lose some height, have back pain, and have a stooped posture. Although osteopenia is not a disease, making changes to your lifestyle and diet can help to prevent osteopenia from developing into osteoporosis. What are the causes? Osteopenia is caused by loss of calcium in the bones.  Bones are constantly changing. Old bone cells are continually being replaced with new bone cells. This process builds new bone. The mineral calcium is needed to build new bone and maintain bone density. Bone density is usually highest around age 35. After that, most people's bodies cannot replace all the bone they have lost with new bone. What increases the risk? You are more likely to develop this condition if:  You are older than age 50.  You are a woman who went through menopause early.  You have a long illness that keeps you in bed.  You do not get enough exercise.  You lack certain nutrients (malnutrition).  You have an overactive thyroid gland (hyperthyroidism).  You smoke.  You drink a lot of alcohol.  You are taking medicines that weaken the bones, such as steroids. What are the signs or symptoms? This condition does not cause any symptoms. You may have a slightly higher risk for bone breaks (fractures), so getting fractures more easily than normal may be an indication of osteopenia. How is this diagnosed? Your health care provider can diagnose this condition with a special type of X-ray exam that measures bone density (dual-energy X-ray absorptiometry, DEXA). This test can measure bone density in your  hips, spine, and wrists. Osteopenia has no symptoms, so this condition is usually diagnosed after a routine bone density screening test is done for osteoporosis. This routine screening is usually done for:  Women who are age 65 or older.  Men who are age 70 or older. If you have risk factors for osteopenia, you may have the screening test at an earlier age. How is this treated? Making dietary and lifestyle changes can lower your risk for osteoporosis. If you have severe osteopenia that is close to becoming osteoporosis, your health care provider may prescribe medicines and dietary supplements such as calcium and vitamin D. These supplements help to rebuild bone density. Follow these instructions at home:   Take over-the-counter and prescription medicines only as told by your health care provider. These include vitamins and supplements.  Eat a diet that is high in calcium and vitamin D. ? Calcium is found in dairy products, beans, salmon, and leafy green vegetables like spinach and broccoli. ? Look for foods that have vitamin D and calcium added to them (fortified foods), such as orange juice, cereal, and bread.  Do 30 or more minutes of a weight-bearing exercise every day, such as walking, jogging, or playing a sport. These types of exercises strengthen the bones.  Take precautions at home to lower your risk of falling, such as: ? Keeping rooms well-lit and free of clutter, such as cords. ? Installing safety rails on stairs. ? Using rubber mats in the bathroom or other areas that are often wet or slippery.  Do not use   any products that contain nicotine or tobacco, such as cigarettes and e-cigarettes. If you need help quitting, ask your health care provider.  Avoid alcohol or limit alcohol intake to no more than 1 drink a day for nonpregnant women and 2 drinks a day for men. One drink equals 12 oz of beer, 5 oz of wine, or 1 oz of hard liquor.  Keep all follow-up visits as told by your  health care provider. This is important. Contact a health care provider if:  You have not had a bone density screening for osteoporosis and you are: ? A woman, age 65 or older. ? A man, age 70 or older.  You are a postmenopausal woman who has not had a bone density screening for osteoporosis.  You are older than age 50 and you want to know if you should have bone density screening for osteoporosis. Summary  Osteopenia is a loss of thickness (density) inside of the bones. Another name for osteopenia is low bone mass.  Osteopenia is not a disease, but it may increase your risk for a condition that causes the bones to become thin and break more easily (osteoporosis).  You may be at risk for osteopenia if you are older than age 50 or if you are a woman who went through early menopause.  Osteopenia does not cause any symptoms, but it can be diagnosed with a bone density screening test.  Dietary and lifestyle changes are the first treatment for osteopenia. These may lower your risk for osteoporosis. This information is not intended to replace advice given to you by your health care provider. Make sure you discuss any questions you have with your health care provider. Document Revised: 10/07/2017 Document Reviewed: 08/03/2017 Elsevier Patient Education  2020 Elsevier Inc.  

## 2020-10-10 NOTE — Progress Notes (Signed)
[facility] [address] [place], [place]  [address] Phone:  [phone]   Fax:  [phone]   Established Patient Office Visit  Subjective:  Patient ID: Angela [name], female    DOB: 08-Feb-1951  Age: 69 y.o. MRN: [id number]  CC: No chief complaint on file.   HPI Angela [name] presents for follow up. She  has a past medical history of Anemia, Hyperlipidemia, Hypertension, and TIA (transient ischemic attack) (2004).   Hypertension Patient is here for follow-up of elevated blood pressure. She is exercising and is adherent to a low-salt diet. Blood pressure is well controlled at home. Cardiac symptoms: none. Patient denies chest pain, dyspnea, fatigue, irregular heart beat, lower extremity edema, palpitations and paroxysmal nocturnal dyspnea. Cardiovascular risk factors: advanced age (older than 49 for men, 49 for women), dyslipidemia and hypertension. Use of agents associated with hypertension: none. History of target organ damage: transient ischemic attack.  Past Medical History:  Diagnosis Date  . Anemia   . Hyperlipidemia   . Hypertension   . TIA (transient ischemic attack) 2004    Past Surgical History:  Procedure Laterality Date  . CATARACT EXTRACTION, BILATERAL  01/2020  . neg hx      Family History  Problem Relation Age of Onset  . Cancer Mother 42       gallbladder  . Hypertension Mother   . Heart disease Father   . Hypertension Father   . Hypertension Brother   . Colon cancer Neg Hx     Social History   Socioeconomic History  . Marital status: Single    Spouse name: Not on file  . Number of children: 1  . Years of education: Not on file  . Highest education level: Not on file  Occupational History  . Occupation: Therapist, nutritional  Tobacco Use  . Smoking status: Never Smoker  . Smokeless tobacco: Never Used  Vaping Use  . Vaping Use: Never used  Substance and Sexual Activity  . Alcohol use: No    Alcohol/week: 0.0 standard  drinks  . Drug use: No  . Sexual activity: Yes    Birth control/protection: Condom  Other Topics Concern  . Not on file  Social History Narrative   Single. 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    Social Determinants of Health   Financial Resource Strain:   . Difficulty of Paying Living Expenses: Not on file  Food Insecurity:   . Worried About Programme researcher, broadcasting/film/video in the Last Year: Not on file  . Ran Out of Food in the Last Year: Not on file  Transportation Needs:   . Lack of Transportation (Medical): Not on file  . Lack of Transportation (Non-Medical): Not on file  Physical Activity:   . Days of Exercise per Week: Not on file  . Minutes of Exercise per Session: Not on file  Stress:   . Feeling of Stress : Not on file  Social Connections:   . Frequency of Communication with Friends and Family: Not on file  . Frequency of Social Gatherings with Friends and Family: Not on file  . Attends Religious Services: Not on file  . Active Member of Clubs or Organizations: Not on file  . Attends Banker Meetings: Not on file  . Marital Status: Not on file  Intimate Partner Violence:   . Fear of Current or Ex-Partner: Not on file  .  Emotionally Abused: Not on file  . Physically Abused: Not on file  . Sexually Abused: Not on file    Outpatient Medications Prior to Visit  Medication Sig Dispense Refill  . aspirin 325 MG tablet Take 1 tablet (325 mg total) by mouth daily. 30 tablet 11  . calcium carbonate (OS-CAL) 600 MG TABS Take 600 mg by mouth 2 (two) times daily with a meal.    . Ferrous Sulfate (IRON) 325 (65 Fe) MG TABS Take 1 tablet (325 mg total) by mouth daily. (Patient taking differently: Take 1 tablet by mouth daily. Pt is taking 1 three times a day) 90 each 11  . Multiple Vitamin (MULTIVITAMIN) tablet Take 1 tablet by mouth daily.    . simvastatin (ZOCOR) 20 MG tablet Take 1 tablet (20 mg total)  by mouth at bedtime. 90 tablet 3  . alendronate (FOSAMAX) 70 MG tablet Take 70 mg by mouth once a week. Take with a full glass of water on an empty stomach.    . BESIVANCE 0.6 % SUSP Place 1 drop into the right eye 3 (three) times daily.    . DUREZOL 0.05 % EMUL Place 1 drop into the left eye 3 (three) times daily.    [place] Kitchen lisinopril-hydrochlorothiazide (ZESTORETIC) 20-12.5 MG tablet Take 1 tablet by mouth daily. 90 tablet 1  . PROLENSA 0.07 % SOLN SMARTSIG:1 Drop(s) Left Eye Every Evening     No facility-administered medications prior to visit.    No Known Allergies  ROS Review of Systems  All other systems reviewed and are negative.     Objective:    Physical Exam Constitutional:      General: She is not in acute distress.    Appearance: She is normal weight. She is not ill-appearing, toxic-appearing or diaphoretic.  HENT:     Head: Normocephalic and atraumatic.     Nose: Nose normal.     Mouth/Throat:     Mouth: Mucous membranes are moist.  Cardiovascular:     Rate and Rhythm: Normal rate and regular rhythm.     Pulses: Normal pulses.     Heart sounds: Normal heart sounds.  Pulmonary:     Effort: Pulmonary effort is normal.     Breath sounds: Normal breath sounds.  Abdominal:     General: Abdomen is flat. Bowel sounds are normal.     Palpations: Abdomen is soft.  Musculoskeletal:     Cervical back: Normal range of motion.  Skin:    General: Skin is warm and dry.     Capillary Refill: Capillary refill takes less than 2 seconds.  Neurological:     General: No focal deficit present.     Mental Status: She is alert and oriented to person, place, and time.  Psychiatric:        Mood and Affect: Mood normal.        Behavior: Behavior normal.        Thought Content: Thought content normal.        Judgment: Judgment normal.     BP (!) 147/70 (BP Location: Right Arm, Patient Position: Sitting, Cuff Size: Normal)   Pulse 67   Temp 98.2 F (36.8 C)   Resp 20   Ht 5'  2.5" (1.588 m)   Wt 102 lb (46.3 kg)   SpO2 100%   BMI 18.36 kg/m  Wt Readings from Last 3 Encounters:  10/10/20 102 lb (46.3 kg)  04/11/20 104 lb (47.2 kg)  05/04/18 104 lb (47.2 kg)  There are no preventive care reminders to display for this patient.  There are no preventive care reminders to display for this patient.  Lab Results  Component Value Date   TSH 2.575 06/03/2015   Lab Results  Component Value Date   WBC 5.3 04/11/2020   HGB 12.4 04/11/2020   HCT 37.4 04/11/2020   MCV 90 04/11/2020   PLT 220 04/11/2020   Lab Results  Component Value Date   NA 140 04/11/2020   K 4.1 04/11/2020   CO2 26 05/04/2018   GLUCOSE 104 (H) 04/11/2020   BUN 17 04/11/2020   CREATININE 0.82 04/11/2020   BILITOT 0.2 04/11/2020   ALKPHOS 89 04/11/2020   AST 24 04/11/2020   ALT 18 12/20/2016   PROT 7.5 04/11/2020   ALBUMIN 4.4 04/11/2020   CALCIUM 9.6 04/11/2020   Lab Results  Component Value Date   CHOL 163 04/11/2020   Lab Results  Component Value Date   HDL 56 04/11/2020   Lab Results  Component Value Date   LDLCALC 89 04/11/2020   Lab Results  Component Value Date   TRIG 100 04/11/2020   Lab Results  Component Value Date   CHOLHDL 2.9 04/11/2020   Lab Results  Component Value Date   HGBA1C 5.6 01/04/2018      Assessment & Plan:   Problem List Items Addressed This Visit      Cardiovascular and Mediastinum   Essential hypertension - Primary (Chronic) Encouraged on going compliance with current medication regimen Encouraged home monitoring and recording BP <130/80 Eating a heart-healthy diet with less salt Encouraged regular physical activity     Relevant Medications   lisinopril-hydrochlorothiazide (ZESTORETIC) 20-12.5 MG tablet   Other Relevant Orders   Comp. Metabolic Panel (12)     Other   Iron deficiency (Chronic) Reevaluation of Ferritin level    Relevant Orders   Iron, TIBC and Ferritin Panel   Hyperlipidemia (Chronic)   Relevant  Medications   lisinopril-hydrochlorothiazide (ZESTORETIC) 20-12.5 MG tablet    Other Visit Diagnoses    Osteopenia, unspecified location     Encourage fosamax along with Ca and vitamin D along with regular exercise    Relevant Orders   VITAMIN D 25 Hydroxy (Vit-D Deficiency, Fractures)      Meds ordered this encounter  Medications  . alendronate (FOSAMAX) 70 MG tablet    Sig: Take 1 tablet (70 mg total) by mouth once a week. Take with a full glass of water on an empty stomach.    Dispense:  12 tablet    Refill:  3    Order Specific Question:   Supervising Provider    Answer:   Quentin Angst [id number]  . lisinopril-hydrochlorothiazide (ZESTORETIC) 20-12.5 MG tablet    Sig: Take 1 tablet by mouth daily.    Dispense:  90 tablet    Refill:  3    Order Specific Question:   Supervising Provider    Answer:   Quentin Angst [id number]    Follow-up: Return in about 6 months (around 04/10/2021).    Barbette Merino, NP

## 2020-10-11 LAB — COMP. METABOLIC PANEL (12)
AST: 23 IU/L (ref 0–40)
Albumin/Globulin Ratio: 1.3 (ref 1.2–2.2)
Albumin: 4.4 g/dL (ref 3.8–4.8)
Alkaline Phosphatase: 100 IU/L (ref 44–121)
BUN/Creatinine Ratio: 23 (ref 12–28)
BUN: 23 mg/dL (ref 8–27)
Bilirubin Total: <0.2 mg/dL (ref 0.0–1.2)
Calcium: 10 mg/dL (ref 8.7–10.3)
Chloride: 100 mmol/L (ref 96–106)
Creatinine, Ser: 0.98 mg/dL (ref 0.57–1.00)
GFR calc Af Amer: 69 mL/min/{1.73_m2} (ref >59)
GFR calc non Af Amer: 59 mL/min/{1.73_m2} — ABNORMAL LOW (ref >59)
Globulin, Total: 3.3 g/dL (ref 1.5–4.5)
Glucose: 84 mg/dL (ref 65–99)
Potassium: 4.6 mmol/L (ref 3.5–5.2)
Sodium: 140 mmol/L (ref 134–144)
Total Protein: 7.7 g/dL (ref 6.0–8.5)

## 2020-10-11 LAB — IRON,TIBC AND FERRITIN PANEL
Ferritin: 853 ng/mL — ABNORMAL HIGH (ref 15–150)
Iron Saturation: 12 % — ABNORMAL LOW (ref 15–55)
Iron: 35 ug/dL (ref 27–139)
Total Iron Binding Capacity: 285 ug/dL (ref 250–450)
UIBC: 250 ug/dL (ref 118–369)

## 2020-10-11 LAB — VITAMIN D 25 HYDROXY (VIT D DEFICIENCY, FRACTURES): Vit D, 25-Hydroxy: 39.6 ng/mL (ref 30.0–100.0)

## 2020-10-13 MED FILL — SIMVASTATIN 20 MG TABLET: 20 | 90 days supply | Qty: 90 | Fill #0

## 2020-12-15 MED FILL — LISINOPRIL-HCTZ 20-12.5 MG: 20-12.5 | 90 days supply | Qty: 90 | Fill #0

## 2021-01-29 ENCOUNTER — Other Ambulatory Visit (HOSPITAL_BASED_OUTPATIENT_CLINIC_OR_DEPARTMENT_OTHER): Payer: Self-pay

## 2021-01-29 MED FILL — SIMVASTATIN 20 MG TABLET: 20 | 90 days supply | Qty: 90 | Fill #1

## 2021-01-29 MED FILL — ALENDRONATE NA 70 MG TAB: 70 | 84 days supply | Qty: 12 | Fill #1

## 2021-03-17 ENCOUNTER — Other Ambulatory Visit: Payer: Self-pay | Admitting: Nurse Practitioner

## 2021-03-17 DIAGNOSIS — Z1231 Encounter for screening mammogram for malignant neoplasm of breast: Secondary | ICD-10-CM

## 2021-03-30 ENCOUNTER — Other Ambulatory Visit (HOSPITAL_COMMUNITY): Payer: Self-pay

## 2021-03-30 MED FILL — Lisinopril & Hydrochlorothiazide Tab 20-12.5 MG: ORAL | 90 days supply | Qty: 90 | Fill #0 | Status: AC

## 2021-04-10 ENCOUNTER — Ambulatory Visit: Payer: Medicare Other | Admitting: Nurse Practitioner

## 2021-05-14 ENCOUNTER — Ambulatory Visit
Admission: RE | Admit: 2021-05-14 | Discharge: 2021-05-14 | Disposition: A | Discharge status: Home / Self Care | Payer: Medicare Other | Source: Ambulatory Visit | Attending: Nurse Practitioner | Admitting: Nurse Practitioner

## 2021-05-14 ENCOUNTER — Other Ambulatory Visit: Payer: Self-pay

## 2021-05-14 DIAGNOSIS — Z1231 Encounter for screening mammogram for malignant neoplasm of breast: Secondary | ICD-10-CM

## 2021-05-18 ENCOUNTER — Other Ambulatory Visit (HOSPITAL_COMMUNITY): Payer: Self-pay

## 2021-05-18 MED FILL — Simvastatin Tab 20 MG: ORAL | 90 days supply | Qty: 90 | Fill #0 | Status: AC

## 2021-05-18 MED FILL — Alendronate Sodium Tab 70 MG: ORAL | 84 days supply | Qty: 12 | Fill #0 | Status: AC

## 2021-05-28 ENCOUNTER — Ambulatory Visit: Payer: Medicare Other | Admitting: Nurse Practitioner

## 2021-05-28 ENCOUNTER — Ambulatory Visit: Payer: Medicare Other

## 2021-07-09 ENCOUNTER — Other Ambulatory Visit (HOSPITAL_COMMUNITY): Payer: Self-pay

## 2021-07-10 ENCOUNTER — Other Ambulatory Visit (HOSPITAL_COMMUNITY): Payer: Self-pay

## 2021-07-14 ENCOUNTER — Other Ambulatory Visit (HOSPITAL_COMMUNITY): Payer: Self-pay

## 2021-07-15 ENCOUNTER — Other Ambulatory Visit (HOSPITAL_COMMUNITY): Payer: Self-pay

## 2021-07-22 ENCOUNTER — Ambulatory Visit (INDEPENDENT_AMBULATORY_CARE_PROVIDER_SITE_OTHER): Payer: Medicare Other | Admitting: Nurse Practitioner

## 2021-07-22 ENCOUNTER — Other Ambulatory Visit (HOSPITAL_COMMUNITY): Payer: Self-pay

## 2021-07-22 ENCOUNTER — Other Ambulatory Visit: Payer: Self-pay

## 2021-07-22 ENCOUNTER — Encounter: Payer: Self-pay | Admitting: Nurse Practitioner

## 2021-07-22 VITALS — BP 134/90 | HR 74 | Temp 97.3°F | Ht 60.0 in | Wt 101.0 lb

## 2021-07-22 DIAGNOSIS — R82998 Other abnormal findings in urine: Secondary | ICD-10-CM | POA: Diagnosis not present

## 2021-07-22 DIAGNOSIS — E7800 Pure hypercholesterolemia, unspecified: Secondary | ICD-10-CM | POA: Diagnosis not present

## 2021-07-22 DIAGNOSIS — I10 Essential (primary) hypertension: Secondary | ICD-10-CM

## 2021-07-22 DIAGNOSIS — D508 Other iron deficiency anemias: Secondary | ICD-10-CM | POA: Diagnosis not present

## 2021-07-22 LAB — POCT URINALYSIS DIP (CLINITEK)
Bilirubin, UA: NEGATIVE
Blood, UA: NEGATIVE
Glucose, UA: NEGATIVE mg/dL
Ketones, POC UA: NEGATIVE mg/dL
Nitrite, UA: NEGATIVE
POC PROTEIN,UA: NEGATIVE
Spec Grav, UA: 1.025 (ref 1.010–1.025)
Urobilinogen, UA: 0.2 E.U./dL
pH, UA: 5.5 (ref 5.0–8.0)

## 2021-07-22 MED ORDER — LISINOPRIL-HYDROCHLOROTHIAZIDE 20-12.5 MG PO TABS
1.0000 | ORAL_TABLET | Freq: Every day | ORAL | 3 refills | Status: DC
  Filled 2021-07-22: qty 90, 90d supply, fill #0
  Filled 2021-11-16: qty 90, 90d supply, fill #1
  Filled 2022-02-26: qty 90, 90d supply, fill #2
  Filled 2022-06-01: qty 90, 90d supply, fill #3

## 2021-07-22 MED ORDER — SIMVASTATIN 20 MG PO TABS
ORAL_TABLET | Freq: Every day | ORAL | 3 refills | Status: DC
  Filled 2021-07-22 – 2021-09-16 (×2): qty 90, 90d supply, fill #0
  Filled 2021-12-28: qty 90, 90d supply, fill #1
  Filled 2022-04-09: qty 90, 90d supply, fill #2
  Filled 2022-07-19: qty 90, 90d supply, fill #3

## 2021-07-22 NOTE — Patient Instructions (Signed)
Health Maintenance, Female Adopting a healthy lifestyle and getting preventive care are important in promoting health and wellness. Ask your health care provider about: The right schedule for you to have regular tests and exams. Things you can do on your own to prevent diseases and keep yourself healthy. What should I know about diet, weight, and exercise? Eat a healthy diet  Eat a diet that includes plenty of vegetables, fruits, low-fat dairy products, and lean protein. Do not eat a lot of foods that are high in solid fats, added sugars, or sodium. Maintain a healthy weight Body mass index (BMI) is used to identify weight problems. It estimates body fat based on height and weight. Your health care provider can help determine your BMI and help you achieve or maintain a healthy weight. Get regular exercise Get regular exercise. This is one of the most important things you can do for your health. Most adults should: Exercise for at least 150 minutes each week. The exercise should increase your heart rate and make you sweat (moderate-intensity exercise). Do strengthening exercises at least twice a week. This is in addition to the moderate-intensity exercise. Spend less time sitting. Even light physical activity can be beneficial. Watch cholesterol and blood lipids Have your blood tested for lipids and cholesterol at 69 years of age, then have this test every 5 years. Have your cholesterol levels checked more often if: Your lipid or cholesterol levels are high. You are older than 69 years of age. You are at high risk for heart disease. What should I know about cancer screening? Depending on your health history and family history, you may need to have cancer screening at various ages. This may include screening for: Breast cancer. Cervical cancer. Colorectal cancer. Skin cancer. Lung cancer. What should I know about heart disease, diabetes, and high blood pressure? Blood pressure and heart  disease High blood pressure causes heart disease and increases the risk of stroke. This is more likely to develop in people who have high blood pressure readings, are of African descent, or are overweight. Have your blood pressure checked: Every 3-5 years if you are 18-39 years of age. Every year if you are 40 years old or older. Diabetes Have regular diabetes screenings. This checks your fasting blood sugar level. Have the screening done: Once every three years after age 40 if you are at a normal weight and have a low risk for diabetes. More often and at a younger age if you are overweight or have a high risk for diabetes. What should I know about preventing infection? Hepatitis B If you have a higher risk for hepatitis B, you should be screened for this virus. Talk with your health care provider to find out if you are at risk for hepatitis B infection. Hepatitis C Testing is recommended for: Everyone born from 1945 through 1965. Anyone with known risk factors for hepatitis C. Sexually transmitted infections (STIs) Get screened for STIs, including gonorrhea and chlamydia, if: You are sexually active and are younger than 69 years of age. You are older than 69 years of age and your health care provider tells you that you are at risk for this type of infection. Your sexual activity has changed since you were last screened, and you are at increased risk for chlamydia or gonorrhea. Ask your health care provider if you are at risk. Ask your health care provider about whether you are at high risk for HIV. Your health care provider may recommend a prescription medicine   to help prevent HIV infection. If you choose to take medicine to prevent HIV, you should first get tested for HIV. You should then be tested every 3 months for as long as you are taking the medicine. Pregnancy If you are about to stop having your period (premenopausal) and you may become pregnant, seek counseling before you get  pregnant. Take 400 to 800 micrograms (mcg) of folic acid every day if you become pregnant. Ask for birth control (contraception) if you want to prevent pregnancy. Osteoporosis and menopause Osteoporosis is a disease in which the bones lose minerals and strength with aging. This can result in bone fractures. If you are 45 years old or older, or if you are at risk for osteoporosis and fractures, ask your health care provider if you should: Be screened for bone loss. Take a calcium or vitamin D supplement to lower your risk of fractures. Be given hormone replacement therapy (HRT) to treat symptoms of menopause. Follow these instructions at home: Lifestyle Do not use any products that contain nicotine or tobacco, such as cigarettes, e-cigarettes, and chewing tobacco. If you need help quitting, ask your health care provider. Do not use street drugs. Do not share needles. Ask your health care provider for help if you need support or information about quitting drugs. Alcohol use Do not drink alcohol if: Your health care provider tells you not to drink. You are pregnant, may be pregnant, or are planning to become pregnant. If you drink alcohol: Limit how much you use to 0-1 drink a day. Limit intake if you are breastfeeding. Be aware of how much alcohol is in your drink. In the [place] one drink equals one 12 oz bottle of beer (355 mL), one 5 oz glass of wine (148 mL), or one 1 oz glass of hard liquor (44 mL). General instructions Schedule regular health, dental, and eye exams. Stay current with your vaccines. Tell your health care provider if: You often feel depressed. You have ever been abused or do not feel safe at home. Summary Adopting a healthy lifestyle and getting preventive care are important in promoting health and wellness. Follow your health care provider's instructions about healthy diet, exercising, and getting tested or screened for diseases. Follow your health care provider's  instructions on monitoring your cholesterol and blood pressure. This information is not intended to replace advice given to you by your health care provider. Make sure you discuss any questions you have with your health care provider. Document Revised: 01/02/2021 Document Reviewed: 10/18/2018 Elsevier Patient Education  2022 [employer] Managing Your Hypertension Hypertension, also called high blood pressure, is when the force of the blood pressing against the walls of the arteries is too strong. Arteries are blood vessels that carry blood from your heart throughout your body. Hypertension forces the heart to work harder to pump blood and may cause the arteries to become narrow or stiff. Understanding blood pressure readings Your personal target blood pressure may vary depending on your medical conditions, your age, and other factors. A blood pressure reading includes a higher number over a lower number. Ideally, your blood pressure should be below 120/80. You should know that: The first, or top, number is called the systolic pressure. It is a measure of the pressure in your arteries as your heart beats. The second, or bottom number, is called the diastolic pressure. It is a measure of the pressure in your arteries as the heart relaxes. Blood pressure is classified into four stages. Based on your  blood pressure reading, your health care provider may use the following stages to determine what type of treatment you need, if any. Systolic pressure and diastolic pressure are measured in a unit called mmHg. Normal Systolic pressure: below 120. Diastolic pressure: below 80. Elevated Systolic pressure: 120-129. Diastolic pressure: below 80. Hypertension stage 1 Systolic pressure: 130-139. Diastolic pressure: 80-89. Hypertension stage 2 Systolic pressure: 140 or above. Diastolic pressure: 90 or above. How can this condition affect me? Managing your hypertension is an important responsibility. Over  time, hypertension can damage the arteries and decrease blood flow to important parts of the body, including the brain, heart, and kidneys. Having untreated or uncontrolled hypertension can lead to: A heart attack. A stroke. A weakened blood vessel (aneurysm). Heart failure. Kidney damage. Eye damage. Metabolic syndrome. Memory and concentration problems. Vascular dementia. What actions can I take to manage this condition? Hypertension can be managed by making lifestyle changes and possibly by taking medicines. Your health care provider will help you make a plan to bring your blood pressure within a normal range. Nutrition  Eat a diet that is high in fiber and potassium, and low in salt (sodium), added sugar, and fat. An example eating plan is called the Dietary Approaches to Stop Hypertension (DASH) diet. To eat this way: Eat plenty of fresh fruits and vegetables. Try to fill one-half of your plate at each meal with fruits and vegetables. Eat whole grains, such as whole-wheat pasta, brown rice, or whole-grain bread. Fill about one-fourth of your plate with whole grains. Eat low-fat dairy products. Avoid fatty cuts of meat, processed or cured meats, and poultry with skin. Fill about one-fourth of your plate with lean proteins such as fish, chicken without skin, beans, eggs, and tofu. Avoid pre-made and processed foods. These tend to be higher in sodium, added sugar, and fat. Reduce your daily sodium intake. Most people with hypertension should eat less than 1,500 mg of sodium a day. Lifestyle  Work with your health care provider to maintain a healthy body weight or to lose weight. Ask what an ideal weight is for you. Get at least 30 minutes of exercise that causes your heart to beat faster (aerobic exercise) most days of the week. Activities may include walking, swimming, or biking. Include exercise to strengthen your muscles (resistance exercise), such as weight lifting, as part of your  weekly exercise routine. Try to do these types of exercises for 30 minutes at least 3 days a week. Do not use any products that contain nicotine or tobacco, such as cigarettes, e-cigarettes, and chewing tobacco. If you need help quitting, ask your health care provider. Control any long-term (chronic) conditions you have, such as high cholesterol or diabetes. Identify your sources of stress and find ways to manage stress. This may include meditation, deep breathing, or making time for fun activities. Alcohol use Do not drink alcohol if: Your health care provider tells you not to drink. You are pregnant, may be pregnant, or are planning to become pregnant. If you drink alcohol: Limit how much you use to: 0-1 drink a day for women. 0-2 drinks a day for men. Be aware of how much alcohol is in your drink. In the [place] one drink equals one 12 oz bottle of beer (355 mL), one 5 oz glass of wine (148 mL), or one 1 oz glass of hard liquor (44 mL). Medicines Your health care provider may prescribe medicine if lifestyle changes are not enough to get your blood pressure under  control and if: Your systolic blood pressure is 130 or higher. Your diastolic blood pressure is 80 or higher. Take medicines only as told by your health care provider. Follow the directions carefully. Blood pressure medicines must be taken as told by your health care provider. The medicine does not work as well when you skip doses. Skipping doses also puts you at risk for problems. Monitoring Before you monitor your blood pressure: Do not smoke, drink caffeinated beverages, or exercise within 30 minutes before taking a measurement. Use the bathroom and empty your bladder (urinate). Sit quietly for at least 5 minutes before taking measurements. Monitor your blood pressure at home as told by your health care provider. To do this: Sit with your back straight and supported. Place your feet flat on the floor. Do not cross your  legs. Support your arm on a flat surface, such as a table. Make sure your upper arm is at heart level. Each time you measure, take two or three readings one minute apart and record the results. You may also need to have your blood pressure checked regularly by your health care provider. General information Talk with your health care provider about your diet, exercise habits, and other lifestyle factors that may be contributing to hypertension. Review all the medicines you take with your health care provider because there may be side effects or interactions. Keep all visits as told by your health care provider. Your health care provider can help you create and adjust your plan for managing your high blood pressure. Where to find more information National Heart, Lung, and Blood Institute: [employer].is American Heart Association: www.heart.org Contact a health care provider if: You think you are having a reaction to medicines you have taken. You have repeated (recurrent) headaches. You feel dizzy. You have swelling in your ankles. You have trouble with your vision. Get help right away if: You develop a severe headache or confusion. You have unusual weakness or numbness, or you feel faint. You have severe pain in your chest or abdomen. You vomit repeatedly. You have trouble breathing. These symptoms may represent a serious problem that is an emergency. Do not wait to see if the symptoms will go away. Get medical help right away. Call your local emergency services (911 in the [place] Do not drive yourself to the hospital. Summary Hypertension is when the force of blood pumping through your arteries is too strong. If this condition is not controlled, it may put you at risk for serious complications. Your personal target blood pressure may vary depending on your medical conditions, your age, and other factors. For most people, a normal blood pressure is less than 120/80. Hypertension is  managed by lifestyle changes, medicines, or both. Lifestyle changes to help manage hypertension include losing weight, eating a healthy, low-sodium diet, exercising more, stopping smoking, and limiting alcohol. This information is not intended to replace advice given to you by your health care provider. Make sure you discuss any questions you have with your health care provider. Document Revised: 11/30/2019 Document Reviewed: 09/25/2019 Elsevier Patient Education  2022 [employer].

## 2021-07-22 NOTE — Progress Notes (Signed)
[facility] Patient [facility] [address] [place], [place]  [address] Phone:  [phone]   Fax:  [phone]   Established Patient Office Visit  Subjective:  Patient ID: Angela [name], female    DOB: 02-19-1951  Age: 70 y.o. MRN: [id number]  CC:  Chief Complaint  Patient presents with   Follow-up    Pt is here today for her follow up visit. Pt states that she has no concerns or issues to discuss.    HPI Angela [name] presents for follow up. She  has a past medical history of Anemia, Hyperlipidemia, Hypertension, and TIA (transient ischemic attack) (2004).   She continues to work full time. She monitored her BP regularly. She monitored her diet. Denies headache, dizziness, visual changes, shortness of breath, dyspnea on exertion, chest pain, nausea, vomiting or any edema.    Past Medical History:  Diagnosis Date   Anemia    Hyperlipidemia    Hypertension    TIA (transient ischemic attack) 2004    Past Surgical History:  Procedure Laterality Date   CATARACT EXTRACTION, BILATERAL  01/2020   neg hx      Family History  Problem Relation Age of Onset   Cancer Mother 10       gallbladder   Hypertension Mother    Heart disease Father    Hypertension Father    Hypertension Brother    Colon cancer Neg Hx     Social History   Socioeconomic History   Marital status: Single    Spouse name: Not on file   Number of children: 1   Years of education: Not on file   Highest education level: Not on file  Occupational History   Occupation: Therapist, nutritional  Tobacco Use   Smoking status: Never   Smokeless tobacco: Never  Vaping Use   Vaping Use: Never used  Substance and Sexual Activity   Alcohol use: No    Alcohol/week: 0.0 standard drinks   Drug use: No   Sexual activity: Yes    Birth control/protection: None  Other Topics Concern   Not on file  Social History Narrative   Single. 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    Social Determinants of Health   Financial Resource Strain: Not on file  Food Insecurity: Not on file  Transportation Needs: Not on file  Physical Activity: Not on file  Stress: Not on file  Social Connections: Not on file  Intimate Partner Violence: Not on file    Outpatient Medications Prior to Visit  Medication Sig Dispense Refill   alendronate (FOSAMAX) 70 MG tablet TAKE 1 TABLET BY MOUTH ONCE WEEKLY ON AN EMPTY STOMACH WITH A FULL GLASS OF WATER 12 tablet 3   aspirin 325 MG tablet Take 1 tablet (325 mg total) by mouth daily. 30 tablet 11   calcium carbonate (OS-CAL) 600 MG TABS Take 600 mg by mouth 2 (two) times daily with a meal.     Ferrous Sulfate (IRON) 325 (65 Fe) MG TABS Take 1 tablet (325 mg total) by mouth daily. (Patient taking differently: Take 1 tablet by mouth daily. Pt is taking 1 three times a day) 90 each 11   lisinopril-hydrochlorothiazide (ZESTORETIC) 20-12.5 MG tablet TAKE 1 TABLET BY MOUTH DAILY. 90 tablet 3   Multiple Vitamin (MULTIVITAMIN) tablet Take 1 tablet by mouth daily.     simvastatin (ZOCOR) 20 MG tablet TAKE 1 TABLET BY  MOUTH AT BEDTIME 90 tablet 3   No facility-administered medications prior to visit.    No Known Allergies  ROS Review of Systems    Objective:    Physical Exam Constitutional:      General: She is not in acute distress.    Appearance: She is normal weight. She is not ill-appearing, toxic-appearing or diaphoretic.  HENT:     Head: Normocephalic and atraumatic.     Nose: Nose normal.     Mouth/Throat:     Mouth: Mucous membranes are moist.  Cardiovascular:     Rate and Rhythm: Normal rate and regular rhythm.     Pulses: Normal pulses.     Heart sounds: Normal heart sounds.  Pulmonary:     Effort: Pulmonary effort is normal.     Breath sounds: Normal breath sounds.  Abdominal:     General: Abdomen is flat. Bowel sounds are normal.     Palpations: Abdomen is soft.     Comments:  hypoactive  Musculoskeletal:        General: Normal range of motion.     Cervical back: Normal range of motion.  Skin:    General: Skin is warm and dry.     Capillary Refill: Capillary refill takes less than 2 seconds.  Neurological:     General: No focal deficit present.     Mental Status: She is alert and oriented to person, place, and time.  Psychiatric:        Mood and Affect: Mood normal.        Behavior: Behavior normal.        Thought Content: Thought content normal.        Judgment: Judgment normal.    BP 134/90   Pulse 74   Temp (!) 97.3 F (36.3 C)   Ht 5' (1.524 m)   Wt 101 lb (45.8 kg)   SpO2 100%   BMI 19.73 kg/m  Wt Readings from Last 3 Encounters:  07/22/21 101 lb (45.8 kg)  10/10/20 102 lb (46.3 kg)  04/11/20 104 lb (47.2 kg)     Health Maintenance Due  Topic Date Due   COVID-19 Vaccine (3 - Pfizer risk series) 03/12/2020    There are no preventive care reminders to display for this patient.  Lab Results  Component Value Date   TSH 2.575 06/03/2015   Lab Results  Component Value Date   WBC 5.3 04/11/2020   HGB 12.4 04/11/2020   HCT 37.4 04/11/2020   MCV 90 04/11/2020   PLT 220 04/11/2020   Lab Results  Component Value Date   NA 140 10/10/2020   K 4.6 10/10/2020   CO2 26 05/04/2018   GLUCOSE 84 10/10/2020   BUN 23 10/10/2020   CREATININE 0.98 10/10/2020   BILITOT <0.2 10/10/2020   ALKPHOS 100 10/10/2020   AST 23 10/10/2020   ALT 18 12/20/2016   PROT 7.7 10/10/2020   ALBUMIN 4.4 10/10/2020   CALCIUM 10.0 10/10/2020   Lab Results  Component Value Date   CHOL 163 04/11/2020   Lab Results  Component Value Date   HDL 56 04/11/2020   Lab Results  Component Value Date   LDLCALC 89 04/11/2020   Lab Results  Component Value Date   TRIG 100 04/11/2020   Lab Results  Component Value Date   CHOLHDL 2.9 04/11/2020   Lab Results  Component Value Date   HGBA1C 5.6 01/04/2018      Assessment & Plan:   Problem List  Items  Addressed This Visit       Cardiovascular and Mediastinum   Essential hypertension - Primary (Chronic) Encouraged on going compliance with current medication regimen Encouraged home monitoring and recording BP <130/80 Eating a heart-healthy diet with less salt Encouraged regular physical activity     Relevant Orders   POCT URINALYSIS DIP (CLINITEK)     Other   Hyperlipidemia (Chronic) Continue with current regimen.  No changes warranted. Good patient compliance. Heart healthy diet   Other Visit Diagnoses     Other iron deficiency anemia    Labs pending for evaluation.         No orders of the defined types were placed in this encounter.   Follow-up: Return in about 6 months (around 01/19/2022) for Follow up HTN [id number].    Barbette Merino, NP

## 2021-07-23 LAB — COMP. METABOLIC PANEL (12)
AST: 26 IU/L (ref 0–40)
Albumin/Globulin Ratio: 1.3 (ref 1.2–2.2)
Albumin: 4.4 g/dL (ref 3.8–4.8)
Alkaline Phosphatase: 97 IU/L (ref 44–121)
BUN/Creatinine Ratio: 21 (ref 12–28)
BUN: 19 mg/dL (ref 8–27)
Bilirubin Total: 0.3 mg/dL (ref 0.0–1.2)
Calcium: 9.6 mg/dL (ref 8.7–10.3)
Chloride: 99 mmol/L (ref 96–106)
Creatinine, Ser: 0.9 mg/dL (ref 0.57–1.00)
Globulin, Total: 3.4 g/dL (ref 1.5–4.5)
Glucose: 106 mg/dL — ABNORMAL HIGH (ref 65–99)
Potassium: 4.5 mmol/L (ref 3.5–5.2)
Sodium: 141 mmol/L (ref 134–144)
Total Protein: 7.8 g/dL (ref 6.0–8.5)
eGFR: 70 mL/min/{1.73_m2} (ref >59)

## 2021-07-23 LAB — LIPID PANEL
Chol/HDL Ratio: 3.3 ratio (ref 0.0–4.4)
Cholesterol, Total: 186 mg/dL (ref 100–199)
HDL: 57 mg/dL (ref >39)
LDL Chol Calc (NIH): 112 mg/dL — ABNORMAL HIGH (ref 0–99)
Triglycerides: 91 mg/dL (ref 0–149)
VLDL Cholesterol Cal: 17 mg/dL (ref 5–40)

## 2021-07-25 LAB — URINE CULTURE

## 2021-07-27 ENCOUNTER — Telehealth: Payer: Self-pay

## 2021-07-27 NOTE — Telephone Encounter (Signed)
Patient is due for Annual Wellness Visit. Call patient- no answer or voice mail to leave a message. 

## 2021-08-03 ENCOUNTER — Ambulatory Visit: Payer: Medicare Other

## 2021-08-03 ENCOUNTER — Other Ambulatory Visit: Payer: Self-pay

## 2021-08-03 NOTE — Patient Instructions (Signed)
Health Maintenance, Female Adopting a healthy lifestyle and getting preventive care are important in promoting health and wellness. Ask your health care provider about: The right schedule for you to have regular tests and exams. Things you can do on your own to prevent diseases and keep yourself healthy. What should I know about diet, weight, and exercise? Eat a healthy diet  Eat a diet that includes plenty of vegetables, fruits, low-fat dairy products, and lean protein. Do not eat a lot of foods that are high in solid fats, added sugars, or sodium. Maintain a healthy weight Body mass index (BMI) is used to identify weight problems. It estimates body fat based on height and weight. Your health care provider can help determine your BMI and help you achieve or maintain a healthy weight. Get regular exercise Get regular exercise. This is one of the most important things you can do for your health. Most adults should: Exercise for at least 150 minutes each week. The exercise should increase your heart rate and make you sweat (moderate-intensity exercise). Do strengthening exercises at least twice a week. This is in addition to the moderate-intensity exercise. Spend less time sitting. Even light physical activity can be beneficial. Watch cholesterol and blood lipids Have your blood tested for lipids and cholesterol at 69 years of age, then have this test every 5 years. Have your cholesterol levels checked more often if: Your lipid or cholesterol levels are high. You are older than 69 years of age. You are at high risk for heart disease. What should I know about cancer screening? Depending on your health history and family history, you may need to have cancer screening at various ages. This may include screening for: Breast cancer. Cervical cancer. Colorectal cancer. Skin cancer. Lung cancer. What should I know about heart disease, diabetes, and high blood pressure? Blood pressure and heart  disease High blood pressure causes heart disease and increases the risk of stroke. This is more likely to develop in people who have high blood pressure readings, are of African descent, or are overweight. Have your blood pressure checked: Every 3-5 years if you are 18-39 years of age. Every year if you are 40 years old or older. Diabetes Have regular diabetes screenings. This checks your fasting blood sugar level. Have the screening done: Once every three years after age 40 if you are at a normal weight and have a low risk for diabetes. More often and at a younger age if you are overweight or have a high risk for diabetes. What should I know about preventing infection? Hepatitis B If you have a higher risk for hepatitis B, you should be screened for this virus. Talk with your health care provider to find out if you are at risk for hepatitis B infection. Hepatitis C Testing is recommended for: Everyone born from 1945 through 1965. Anyone with known risk factors for hepatitis C. Sexually transmitted infections (STIs) Get screened for STIs, including gonorrhea and chlamydia, if: You are sexually active and are younger than 69 years of age. You are older than 69 years of age and your health care provider tells you that you are at risk for this type of infection. Your sexual activity has changed since you were last screened, and you are at increased risk for chlamydia or gonorrhea. Ask your health care provider if you are at risk. Ask your health care provider about whether you are at high risk for HIV. Your health care provider may recommend a prescription medicine   to help prevent HIV infection. If you choose to take medicine to prevent HIV, you should first get tested for HIV. You should then be tested every 3 months for as long as you are taking the medicine. Pregnancy If you are about to stop having your period (premenopausal) and you may become pregnant, seek counseling before you get  pregnant. Take 400 to 800 micrograms (mcg) of folic acid every day if you become pregnant. Ask for birth control (contraception) if you want to prevent pregnancy. Osteoporosis and menopause Osteoporosis is a disease in which the bones lose minerals and strength with aging. This can result in bone fractures. If you are 65 years old or older, or if you are at risk for osteoporosis and fractures, ask your health care provider if you should: Be screened for bone loss. Take a calcium or vitamin D supplement to lower your risk of fractures. Be given hormone replacement therapy (HRT) to treat symptoms of menopause. Follow these instructions at home: Lifestyle Do not use any products that contain nicotine or tobacco, such as cigarettes, e-cigarettes, and chewing tobacco. If you need help quitting, ask your health care provider. Do not use street drugs. Do not share needles. Ask your health care provider for help if you need support or information about quitting drugs. Alcohol use Do not drink alcohol if: Your health care provider tells you not to drink. You are pregnant, may be pregnant, or are planning to become pregnant. If you drink alcohol: Limit how much you use to 0-1 drink a day. Limit intake if you are breastfeeding. Be aware of how much alcohol is in your drink. In the [place] one drink equals one 12 oz bottle of beer (355 mL), one 5 oz glass of wine (148 mL), or one 1 oz glass of hard liquor (44 mL). General instructions Schedule regular health, dental, and eye exams. Stay current with your vaccines. Tell your health care provider if: You often feel depressed. You have ever been abused or do not feel safe at home. Summary Adopting a healthy lifestyle and getting preventive care are important in promoting health and wellness. Follow your health care provider's instructions about healthy diet, exercising, and getting tested or screened for diseases. Follow your health care provider's  instructions on monitoring your cholesterol and blood pressure. This information is not intended to replace advice given to you by your health care provider. Make sure you discuss any questions you have with your health care provider. Document Revised: 01/02/2021 Document Reviewed: 10/18/2018 Elsevier Patient Education  2022 Elsevier Inc.  

## 2021-08-04 DIAGNOSIS — Z0000 Encounter for general adult medical examination without abnormal findings: Secondary | ICD-10-CM

## 2021-08-27 ENCOUNTER — Other Ambulatory Visit (HOSPITAL_COMMUNITY): Payer: Self-pay

## 2021-08-28 ENCOUNTER — Other Ambulatory Visit (HOSPITAL_COMMUNITY): Payer: Self-pay

## 2021-09-08 ENCOUNTER — Other Ambulatory Visit (HOSPITAL_COMMUNITY): Payer: Self-pay

## 2021-09-08 MED FILL — Alendronate Sodium Tab 70 MG: ORAL | 84 days supply | Qty: 12 | Fill #1 | Status: AC

## 2021-09-16 ENCOUNTER — Other Ambulatory Visit (HOSPITAL_COMMUNITY): Payer: Self-pay

## 2021-11-16 ENCOUNTER — Other Ambulatory Visit (HOSPITAL_COMMUNITY): Payer: Self-pay

## 2021-12-01 ENCOUNTER — Other Ambulatory Visit (HOSPITAL_COMMUNITY): Payer: Self-pay

## 2021-12-01 ENCOUNTER — Other Ambulatory Visit: Payer: Self-pay | Admitting: Nurse Practitioner

## 2021-12-03 ENCOUNTER — Other Ambulatory Visit (HOSPITAL_COMMUNITY): Payer: Self-pay

## 2021-12-04 ENCOUNTER — Other Ambulatory Visit (HOSPITAL_COMMUNITY): Payer: Self-pay

## 2021-12-04 ENCOUNTER — Other Ambulatory Visit: Payer: Self-pay | Admitting: Nurse Practitioner

## 2021-12-10 ENCOUNTER — Other Ambulatory Visit (HOSPITAL_COMMUNITY): Payer: Self-pay

## 2021-12-10 MED ORDER — ALENDRONATE SODIUM 70 MG PO TABS
ORAL_TABLET | ORAL | 3 refills | Status: DC
  Filled 2021-12-10: qty 12, 84d supply, fill #0
  Filled 2022-03-16: qty 12, 84d supply, fill #1
  Filled 2022-05-24: qty 12, 84d supply, fill #2
  Filled 2022-08-24: qty 12, 84d supply, fill #3

## 2021-12-28 ENCOUNTER — Other Ambulatory Visit (HOSPITAL_COMMUNITY): Payer: Self-pay

## 2022-01-20 ENCOUNTER — Ambulatory Visit: Payer: Medicare Other | Admitting: Nurse Practitioner

## 2022-01-21 ENCOUNTER — Other Ambulatory Visit: Payer: Self-pay

## 2022-01-21 ENCOUNTER — Encounter: Payer: Self-pay | Admitting: Nurse Practitioner

## 2022-01-21 ENCOUNTER — Ambulatory Visit (INDEPENDENT_AMBULATORY_CARE_PROVIDER_SITE_OTHER): Payer: Medicare Other | Admitting: Nurse Practitioner

## 2022-01-21 VITALS — BP 141/73 | HR 68 | Temp 97.9°F | Ht 60.0 in | Wt 101.6 lb

## 2022-01-21 DIAGNOSIS — E782 Mixed hyperlipidemia: Secondary | ICD-10-CM

## 2022-01-21 DIAGNOSIS — Z1329 Encounter for screening for other suspected endocrine disorder: Secondary | ICD-10-CM | POA: Diagnosis not present

## 2022-01-21 DIAGNOSIS — M8580 Other specified disorders of bone density and structure, unspecified site: Secondary | ICD-10-CM

## 2022-01-21 DIAGNOSIS — I10 Essential (primary) hypertension: Secondary | ICD-10-CM | POA: Diagnosis not present

## 2022-01-21 NOTE — Progress Notes (Addendum)
? ?[facility] ?[place], [place]  [address] ?Phone:  [phone]   Fax:  [phone] ? ? ?Established Patient Office Visit ? ?Subjective:  ?Patient ID: Angela [name], female    DOB: [birth year]  Age: 71 y.o. MRN: [id number] ? ?CC:  ?Chief Complaint  ?Patient presents with  ? Follow-up  ?  Patient is here today for her 6 month follow up visit with no concerns or issues to discuss.  ? ? ?HPI ?Angela [name] presents for follow up. She  has a past medical history of Anemia, Hyperlipidemia, Hypertension, and TIA (transient ischemic attack) (2004).  ? ?Ms. Angela [name] is in today for follow up for Hypertension. The current prescribed treatment is Zestoretic  Compliance is reported and home blood pressure monitoring is done with a range 90-140/ 70-80. The  DASH diet is being followed. An exercise regimen is ongoing. There is a goal to get off medications if possible. Denies headache, dizziness, visual changes, shortness of breath, dyspnea on exertion, chest pain, nausea, vomiting or any edema.  ? ? ?Past Medical History:  ?Diagnosis Date  ? Anemia   ? Hyperlipidemia   ? Hypertension   ? TIA (transient ischemic attack) 2004  ? ? ?Past Surgical History:  ?Procedure Laterality Date  ? CATARACT EXTRACTION, BILATERAL  01/2020  ? neg hx    ? ? ?Family History  ?Problem Relation Age of Onset  ? Cancer Mother 29  ?     gallbladder  ? Hypertension Mother   ? Heart disease Father   ? Hypertension Father   ? Hypertension Brother   ? Colon cancer Neg Hx   ? ? ?Social History  ? ?Socioeconomic History  ? Marital status: Single  ?  Spouse name: Not on file  ? Number of children: 1  ? Years of education: Not on file  ? Highest education level: Not on file  ?Occupational History  ? Occupation: Probation officer  ?Tobacco Use  ? Smoking status: Never  ? Smokeless tobacco: Never  ?Vaping Use  ? Vaping Use: Never used  ?Substance and Sexual Activity  ? Alcohol use: No  ?  Alcohol/week: 0.0 standard drinks  ? Drug  use: No  ? Sexual activity: Yes  ?  Birth control/protection: None  ?Other Topics Concern  ? Not on file  ?Social History Narrative  ? Single. Formerly worked in [employer] fundraiser. Has one grown child and 2 grandsons. 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   ? ?Social Determinants of Health  ? ?Financial Resource Strain: Not on file  ?Food Insecurity: Not on file  ?Transportation Needs: Not on file  ?Physical Activity: Not on file  ?Stress: Not on file  ?Social Connections: Not on file  ?Intimate Partner Violence: Not on file  ? ? ?Outpatient Medications Prior to Visit  ?Medication Sig Dispense Refill  ? alendronate (FOSAMAX) 70 MG tablet TAKE 1 TABLET BY MOUTH ONCE WEEKLY ON AN EMPTY STOMACH WITH A FULL GLASS OF WATER 12 tablet 3  ? aspirin 325 MG tablet Take 1 tablet (325 mg total) by mouth daily. 30 tablet 11  ? calcium carbonate (OS-CAL) 600 MG TABS Take 600 mg by mouth 2 (two) times daily with a meal.    ? Ferrous Sulfate (IRON) 325 (65 Fe) MG TABS Take 1 tablet (325 mg total) by mouth daily. (Patient taking differently: Take 1 tablet by mouth daily. Pt is taking 1 three times a day) 90 each 11  ? lisinopril-hydrochlorothiazide (ZESTORETIC)  20-12.5 MG tablet TAKE 1 TABLET BY MOUTH DAILY. 90 tablet 3  ? Multiple Vitamin (MULTIVITAMIN) tablet Take 1 tablet by mouth daily.    ? simvastatin (ZOCOR) 20 MG tablet TAKE 1 TABLET BY MOUTH AT BEDTIME 90 tablet 3  ? ?No facility-administered medications prior to visit.  ? ? ?No Known Allergies ? ?ROS ?Review of Systems ? ?  ?Objective:  ?  ?Physical Exam ?Constitutional:   ?   General: She is not in acute distress. ?   Appearance: She is normal weight.  ?HENT:  ?   Head: Normocephalic and atraumatic.  ?   Nose: Nose normal.  ?   Mouth/Throat:  ?   Mouth: Mucous membranes are moist.  ?Cardiovascular:  ?   Rate and Rhythm: Normal rate and regular rhythm.  ?   Pulses: Normal pulses.  ?   Heart sounds: Normal heart sounds.  ?Pulmonary:  ?   Effort: Pulmonary effort is normal.  ?    Breath sounds: Normal breath sounds.  ?Abdominal:  ?   General: Bowel sounds are normal.  ?   Palpations: Abdomen is soft.  ?Musculoskeletal:     ?   General: Normal range of motion.  ?   Cervical back: Normal range of motion.  ?Skin: ?   General: Skin is warm and dry.  ?   Capillary Refill: Capillary refill takes less than 2 seconds.  ?Neurological:  ?   General: No focal deficit present.  ?   Mental Status: She is alert and oriented to person, place, and time.  ?Psychiatric:     ?   Mood and Affect: Mood normal.     ?   Behavior: Behavior normal.     ?   Thought Content: Thought content normal.     ?   Judgment: Judgment normal.  ? ? ?BP (!) 141/73   Pulse 68   Temp 97.9 ?F (36.6 ?C)   Ht 5' (1.524 m)   Wt 101 lb 9.6 oz (46.1 kg)   SpO2 100%   BMI 19.84 kg/m?  ?Wt Readings from Last 3 Encounters:  ?01/21/22 101 lb 9.6 oz (46.1 kg)  ?07/22/21 101 lb (45.8 kg)  ?10/10/20 102 lb (46.3 kg)  ? ? ? ?There are no preventive care reminders to display for this patient. ? ? ?There are no preventive care reminders to display for this patient. ? ?Lab Results  ?Component Value Date  ? TSH 2.575 06/03/2015  ? ?Lab Results  ?Component Value Date  ? WBC 5.3 04/11/2020  ? HGB 12.4 04/11/2020  ? HCT 37.4 04/11/2020  ? MCV 90 04/11/2020  ? PLT 220 04/11/2020  ? ?Lab Results  ?Component Value Date  ? NA 141 07/22/2021  ? K 4.5 07/22/2021  ? CO2 26 05/04/2018  ? GLUCOSE 106 (H) 07/22/2021  ? BUN 19 07/22/2021  ? CREATININE 0.90 07/22/2021  ? BILITOT 0.3 07/22/2021  ? ALKPHOS 97 07/22/2021  ? AST 26 07/22/2021  ? ALT 18 12/20/2016  ? PROT 7.8 07/22/2021  ? ALBUMIN 4.4 07/22/2021  ? CALCIUM 9.6 07/22/2021  ? EGFR 70 07/22/2021  ? ?Lab Results  ?Component Value Date  ? CHOL 186 07/22/2021  ? ?Lab Results  ?Component Value Date  ? HDL 57 07/22/2021  ? ?Lab Results  ?Component Value Date  ? LDLCALC 112 (H) 07/22/2021  ? ?Lab Results  ?Component Value Date  ? TRIG 91 07/22/2021  ? ?Lab Results  ?Component Value Date  ? CHOLHDL 3.3  07/22/2021  ? ?Lab Results  ?Component Value Date  ? HGBA1C 5.6 01/04/2018  ? ? ?  ?Assessment & Plan:  ? ?Problem List Items Addressed This Visit   ? ?  ? Cardiovascular and Mediastinum  ? Essential hypertension - Primary (Chronic) ?Encouraged on going compliance with current medication regimen ?Encouraged home monitoring and recording BP <130/80 ?Eating a heart-healthy diet with less salt ?Encouraged regular physical activity  ?Recommend Weight loss ? ?  ?  ? Other  ? Hyperlipidemia (Chronic) ?Stable  ?Encouraged on going compliance with current medication regimen. I recommend eating a heart-healthy diet; low in fat and cholesterol along with regular exercise. This will promote weight loss and improve cholesterol.  ?  ? ?Other Visit Diagnoses   ? ? Osteopenia, unspecified location     ?Stable ?Continues with fosamax started 2019  ? ?  ? ? ?No orders of the defined types were placed in this encounter. ? ? ?Follow-up: No follow-ups on file.  ? ? ?Vevelyn Francois, NP ?

## 2022-01-22 LAB — COMP. METABOLIC PANEL (12)
AST: 25 IU/L (ref 0–40)
Albumin/Globulin Ratio: 1.3 (ref 1.2–2.2)
Albumin: 4.3 g/dL (ref 3.8–4.8)
Alkaline Phosphatase: 93 IU/L (ref 44–121)
BUN/Creatinine Ratio: 20 (ref 12–28)
BUN: 19 mg/dL (ref 8–27)
Bilirubin Total: 0.2 mg/dL (ref 0.0–1.2)
Calcium: 9.4 mg/dL (ref 8.7–10.3)
Chloride: 101 mmol/L (ref 96–106)
Creatinine, Ser: 0.94 mg/dL (ref 0.57–1.00)
Globulin, Total: 3.2 g/dL (ref 1.5–4.5)
Glucose: 99 mg/dL (ref 70–99)
Potassium: 4.2 mmol/L (ref 3.5–5.2)
Sodium: 142 mmol/L (ref 134–144)
Total Protein: 7.5 g/dL (ref 6.0–8.5)
eGFR: 66 mL/min/{1.73_m2} (ref >59)

## 2022-01-22 LAB — LIPID PANEL
Chol/HDL Ratio: 2.6 ratio (ref 0.0–4.4)
Cholesterol, Total: 172 mg/dL (ref 100–199)
HDL: 67 mg/dL (ref >39)
LDL Chol Calc (NIH): 93 mg/dL (ref 0–99)
Triglycerides: 64 mg/dL (ref 0–149)
VLDL Cholesterol Cal: 12 mg/dL (ref 5–40)

## 2022-01-22 LAB — TSH: TSH: 2.04 u[IU]/mL (ref 0.450–4.500)

## 2022-02-26 ENCOUNTER — Other Ambulatory Visit (HOSPITAL_COMMUNITY): Payer: Self-pay

## 2022-03-09 ENCOUNTER — Other Ambulatory Visit: Payer: Self-pay | Admitting: Nurse Practitioner

## 2022-03-09 DIAGNOSIS — Z1231 Encounter for screening mammogram for malignant neoplasm of breast: Secondary | ICD-10-CM

## 2022-03-16 ENCOUNTER — Other Ambulatory Visit (HOSPITAL_COMMUNITY): Payer: Self-pay

## 2022-03-31 ENCOUNTER — Other Ambulatory Visit (HOSPITAL_COMMUNITY): Payer: Self-pay

## 2022-04-09 ENCOUNTER — Other Ambulatory Visit (HOSPITAL_COMMUNITY): Payer: Self-pay

## 2022-05-17 ENCOUNTER — Ambulatory Visit
Admission: RE | Admit: 2022-05-17 | Discharge: 2022-05-17 | Disposition: A | Discharge status: Home / Self Care | Payer: Medicare Other | Source: Ambulatory Visit | Attending: Nurse Practitioner | Admitting: Nurse Practitioner

## 2022-05-17 DIAGNOSIS — Z1231 Encounter for screening mammogram for malignant neoplasm of breast: Secondary | ICD-10-CM

## 2022-05-24 ENCOUNTER — Other Ambulatory Visit (HOSPITAL_COMMUNITY): Payer: Self-pay

## 2022-05-31 ENCOUNTER — Telehealth: Payer: Self-pay | Admitting: Emergency Medicine

## 2022-05-31 NOTE — Telephone Encounter (Signed)
Copied from CRM [id number]. Topic: Appointment Scheduling - Scheduling Inquiry for Clinic >> May 31, 2022  8:29 AM Shanda Bumps I wrote: Reason for CRM: Pt called and stated that she would like a call from the nurse in regards to scheduling a physical in September. She states that she use to see [facility]. Please advise

## 2022-06-01 ENCOUNTER — Other Ambulatory Visit (HOSPITAL_COMMUNITY): Payer: Self-pay

## 2022-06-03 ENCOUNTER — Other Ambulatory Visit (HOSPITAL_COMMUNITY): Payer: Self-pay

## 2022-06-11 ENCOUNTER — Telehealth: Payer: Self-pay

## 2022-06-11 NOTE — Patient Outreach (Signed)
  Care Coordination   06/11/2022 Name: Shellie Rogoff MRN: [id number] DOB: 1951-09-08   Care Coordination Outreach Attempts:  An unsuccessful telephone outreach was attempted today to offer the patient information about available care coordination services as a benefit of their health plan.   Follow Up Plan:  Additional outreach attempts will be made to offer the patient care coordination information and services.   Encounter Outcome:  No Answer  Care Coordination Interventions Activated:  No   Care Coordination Interventions:  No, not indicated    Kathyrn Sheriff, RN, MSN, BSN, CCM Care Coordinator [phone]

## 2022-06-17 NOTE — Progress Notes (Signed)
Created in error-unable to get hold of pt

## 2022-07-19 ENCOUNTER — Other Ambulatory Visit (HOSPITAL_COMMUNITY): Payer: Self-pay

## 2022-07-23 ENCOUNTER — Encounter: Payer: Self-pay | Admitting: Nurse Practitioner

## 2022-07-23 ENCOUNTER — Ambulatory Visit (INDEPENDENT_AMBULATORY_CARE_PROVIDER_SITE_OTHER): Payer: Medicare Other | Admitting: Nurse Practitioner

## 2022-07-23 ENCOUNTER — Ambulatory Visit: Payer: Medicare Other | Admitting: Nurse Practitioner

## 2022-07-23 ENCOUNTER — Other Ambulatory Visit (HOSPITAL_COMMUNITY): Payer: Self-pay

## 2022-07-23 ENCOUNTER — Other Ambulatory Visit (INDEPENDENT_AMBULATORY_CARE_PROVIDER_SITE_OTHER): Payer: Medicare Other

## 2022-07-23 VITALS — BP 141/68 | HR 68 | Temp 97.3°F | Wt 104.6 lb

## 2022-07-23 DIAGNOSIS — I10 Essential (primary) hypertension: Secondary | ICD-10-CM | POA: Diagnosis not present

## 2022-07-23 DIAGNOSIS — Z0000 Encounter for general adult medical examination without abnormal findings: Secondary | ICD-10-CM | POA: Diagnosis not present

## 2022-07-23 DIAGNOSIS — R8290 Unspecified abnormal findings in urine: Secondary | ICD-10-CM | POA: Diagnosis not present

## 2022-07-23 DIAGNOSIS — E7800 Pure hypercholesterolemia, unspecified: Secondary | ICD-10-CM | POA: Diagnosis not present

## 2022-07-23 LAB — POCT URINALYSIS DIP (CLINITEK)
Bilirubin, UA: NEGATIVE
Blood, UA: NEGATIVE
Glucose, UA: NEGATIVE mg/dL
Ketones, POC UA: NEGATIVE mg/dL
Nitrite, UA: NEGATIVE
POC PROTEIN,UA: NEGATIVE
Spec Grav, UA: 1.025 (ref 1.010–1.025)
Urobilinogen, UA: 0.2 E.U./dL
pH, UA: 7 (ref 5.0–8.0)

## 2022-07-23 MED ORDER — SIMVASTATIN 20 MG PO TABS
20.0000 mg | ORAL_TABLET | Freq: Every day | ORAL | 3 refills | Status: DC
  Filled 2022-07-23: qty 90, fill #0
  Filled 2022-10-26: qty 90, 90d supply, fill #0

## 2022-07-23 MED ORDER — LISINOPRIL-HYDROCHLOROTHIAZIDE 20-12.5 MG PO TABS
1.0000 | ORAL_TABLET | Freq: Every day | ORAL | 3 refills | Status: DC
  Filled 2022-07-23 – 2022-09-06 (×2): qty 90, 90d supply, fill #0
  Filled 2022-12-15: qty 90, 90d supply, fill #1

## 2022-07-23 NOTE — Progress Notes (Signed)
@Patient  ID: , female    DOB: 11-03-1951, 71 y.o.   MRN: [id number]  Chief Complaint  Patient presents with   Hypertension    Pt is here for  HTN follow visit. Pt is requesting a refill on lisinopril-hydrochlorothiazide, simvastatin     Referring provider: [id number], NP   HPI  Angela [name] presents for follow up. She  has a past medical history of Anemia, Hyperlipidemia, Hypertension, and TIA (transient ischemic attack) (2004).    Angela [name] is in today for follow up for Hypertension. The current prescribed treatment is Zestoretic  Compliance is reported and home blood pressure monitoring is done with a range 90-140/ 70-80. The  DASH diet is being followed. An exercise regimen is ongoing. There is a goal to get off medications if possible. Denies headache, dizziness, visual changes, shortness of breath, dyspnea on exertion, chest pain, nausea, vomiting or any edema.      No Known Allergies  Immunization History  Administered Date(s) Administered   PFIZER(Purple Top)SARS-COV-2 Vaccination 01/27/2020, 02/02/2020, 02/13/2020   PPD Test 12/11/2013   Pneumococcal Conjugate-13 01/04/2018    Past Medical History:  Diagnosis Date   Anemia    Hyperlipidemia    Hypertension    TIA (transient ischemic attack) 2004    Tobacco History: Social History   Tobacco Use  Smoking Status Never  Smokeless Tobacco Never   Counseling given: Not Answered   Outpatient Encounter Medications as of 07/23/2022  Medication Sig   alendronate (FOSAMAX) 70 MG tablet TAKE 1 TABLET BY MOUTH ONCE WEEKLY ON AN EMPTY STOMACH WITH A FULL GLASS OF WATER   aspirin 325 MG tablet Take 1 tablet (325 mg total) by mouth daily.   calcium carbonate (OS-CAL) 600 MG TABS Take 600 mg by mouth 2 (two) times daily with a meal.   Ferrous Sulfate (IRON) 325 (65 Fe) MG TABS Take 1 tablet (325 mg total) by mouth daily. (Patient taking differently: Take 1 tablet by mouth daily. Pt is taking 1 three  times a day)   Multiple Vitamin (MULTIVITAMIN) tablet Take 1 tablet by mouth daily.   [DISCONTINUED] lisinopril-hydrochlorothiazide (ZESTORETIC) 20-12.5 MG tablet TAKE 1 TABLET BY MOUTH DAILY.   [DISCONTINUED] simvastatin (ZOCOR) 20 MG tablet TAKE 1 TABLET BY MOUTH AT BEDTIME   lisinopril-hydrochlorothiazide (ZESTORETIC) 20-12.5 MG tablet TAKE 1 TABLET BY MOUTH DAILY.   simvastatin (ZOCOR) 20 MG tablet TAKE 1 TABLET BY MOUTH AT BEDTIME   No facility-administered encounter medications on file as of 07/23/2022.     Review of Systems  Review of Systems  Constitutional: Negative.   HENT: Negative.    Cardiovascular: Negative.   Gastrointestinal: Negative.   Allergic/Immunologic: Negative.   Neurological: Negative.   Psychiatric/Behavioral: Negative.         Physical Exam  BP (!) 141/68 (BP Location: Left Arm, Patient Position: Sitting, Cuff Size: Normal)   Pulse 68   Temp (!) 97.3 F (36.3 C)   Wt 104 lb 9.6 oz (47.4 kg)   SpO2 100%   BMI 20.43 kg/m   Wt Readings from Last 5 Encounters:  07/23/22 104 lb 9.6 oz (47.4 kg)  01/21/22 101 lb 9.6 oz (46.1 kg)  07/22/21 101 lb (45.8 kg)  10/10/20 102 lb (46.3 kg)  04/11/20 104 lb (47.2 kg)     Physical Exam Vitals and nursing note reviewed.  Constitutional:      General: She is not in acute distress.    Appearance: She is well-developed.  Cardiovascular:  Rate and Rhythm: Normal rate and regular rhythm.  Pulmonary:     Effort: Pulmonary effort is normal.     Breath sounds: Normal breath sounds.  Neurological:     Mental Status: She is alert and oriented to person, place, and time.      Assessment & Plan:   Essential hypertension - CBC - Comprehensive metabolic panel - lisinopril-hydrochlorothiazide (ZESTORETIC) 20-12.5 MG tablet; TAKE 1 TABLET BY MOUTH DAILY.  Dispense: 90 tablet; Refill: 3   2. Pure hypercholesterolemia  - simvastatin (ZOCOR) 20 MG tablet; TAKE 1 TABLET BY MOUTH AT BEDTIME  Dispense: 90  tablet; Refill: 3  Follow up:  Follow up in 6 months     Ivonne Andrew, NP 07/23/2022

## 2022-07-23 NOTE — Assessment & Plan Note (Signed)
-   CBC - Comprehensive metabolic panel - lisinopril-hydrochlorothiazide (ZESTORETIC) 20-12.5 MG tablet; TAKE 1 TABLET BY MOUTH DAILY.  Dispense: 90 tablet; Refill: 3   2. Pure hypercholesterolemia  - simvastatin (ZOCOR) 20 MG tablet; TAKE 1 TABLET BY MOUTH AT BEDTIME  Dispense: 90 tablet; Refill: 3  Follow up:  Follow up in 6 months

## 2022-07-23 NOTE — Patient Instructions (Addendum)
1. Essential hypertension  - CBC - Comprehensive metabolic panel - lisinopril-hydrochlorothiazide (ZESTORETIC) 20-12.5 MG tablet; TAKE 1 TABLET BY MOUTH DAILY.  Dispense: 90 tablet; Refill: 3   2. Pure hypercholesterolemia  - simvastatin (ZOCOR) 20 MG tablet; TAKE 1 TABLET BY MOUTH AT BEDTIME  Dispense: 90 tablet; Refill: 3  Follow up:  Follow up in 6 months

## 2022-07-24 LAB — COMPREHENSIVE METABOLIC PANEL
ALT: 13 IU/L (ref 0–32)
AST: 25 IU/L (ref 0–40)
Albumin/Globulin Ratio: 1.5 (ref 1.2–2.2)
Albumin: 4.4 g/dL (ref 3.9–4.9)
Alkaline Phosphatase: 81 IU/L (ref 44–121)
BUN/Creatinine Ratio: 26 (ref 12–28)
BUN: 24 mg/dL (ref 8–27)
Bilirubin Total: 0.3 mg/dL (ref 0.0–1.2)
CO2: 26 mmol/L (ref 20–29)
Calcium: 9.7 mg/dL (ref 8.7–10.3)
Chloride: 101 mmol/L (ref 96–106)
Creatinine, Ser: 0.91 mg/dL (ref 0.57–1.00)
Globulin, Total: 2.9 g/dL (ref 1.5–4.5)
Glucose: 93 mg/dL (ref 70–99)
Potassium: 4 mmol/L (ref 3.5–5.2)
Sodium: 141 mmol/L (ref 134–144)
Total Protein: 7.3 g/dL (ref 6.0–8.5)
eGFR: 68 mL/min/{1.73_m2} (ref >59)

## 2022-07-24 LAB — CBC
Hematocrit: 35.4 % (ref 34.0–46.6)
Hemoglobin: 11.3 g/dL (ref 11.1–15.9)
MCH: 28.4 pg (ref 26.6–33.0)
MCHC: 31.9 g/dL (ref 31.5–35.7)
MCV: 89 fL (ref 79–97)
Platelets: 245 10*3/uL (ref 150–450)
RBC: 3.98 x10E6/uL (ref 3.77–5.28)
RDW: 13.2 % (ref 11.7–15.4)
WBC: 4.5 10*3/uL (ref 3.4–10.8)

## 2022-07-26 LAB — URINE CULTURE

## 2022-08-09 ENCOUNTER — Telehealth: Payer: Self-pay

## 2022-08-09 NOTE — Patient Outreach (Signed)
  Care Coordination   08/09/2022 Name: Angela [name] MRN: [id number] DOB: 02/24/1951   Care Coordination Outreach Attempts:  A second unsuccessful outreach was attempted today to offer the patient with information about available care coordination services as a benefit of their health plan.     Follow Up Plan:  Additional outreach attempts will be made to offer the patient care coordination information and services.   Encounter Outcome:  No Answer  Care Coordination Interventions Activated:  No   Care Coordination Interventions:  No, not indicated    Thea Silversmith, RN, MSN, BSN, [place] Coordinator [phone]

## 2022-08-24 ENCOUNTER — Other Ambulatory Visit (HOSPITAL_COMMUNITY): Payer: Self-pay

## 2022-09-06 ENCOUNTER — Other Ambulatory Visit (HOSPITAL_COMMUNITY): Payer: Self-pay

## 2022-10-26 ENCOUNTER — Other Ambulatory Visit (HOSPITAL_COMMUNITY): Payer: Self-pay

## 2022-10-26 ENCOUNTER — Other Ambulatory Visit: Payer: Self-pay

## 2022-11-22 ENCOUNTER — Other Ambulatory Visit (HOSPITAL_COMMUNITY): Payer: Self-pay

## 2022-11-22 ENCOUNTER — Other Ambulatory Visit: Payer: Self-pay | Admitting: Nurse Practitioner

## 2022-11-22 ENCOUNTER — Other Ambulatory Visit: Payer: Self-pay

## 2022-11-22 MED ORDER — ALENDRONATE SODIUM 70 MG PO TABS
ORAL_TABLET | ORAL | 3 refills | Status: DC
  Filled 2022-11-22: qty 12, 84d supply, fill #0

## 2022-12-01 IMAGING — MG MM DIGITAL SCREENING BILAT W/ TOMO AND CAD
6 of 10 series · 6 of 30 positions shown · non-contrast
Comparison: Previous exam(s).

CLINICAL DATA: Screening.

EXAM:
DIGITAL SCREENING BILATERAL MAMMOGRAM WITH TOMOSYNTHESIS AND CAD
TECHNIQUE: Bilateral screening digital craniocaudal and mediolateral oblique
mammograms were obtained. Bilateral screening digital breast
tomosynthesis was performed. The images were evaluated with
computer-aided detection.

[L MLO synth-2D]
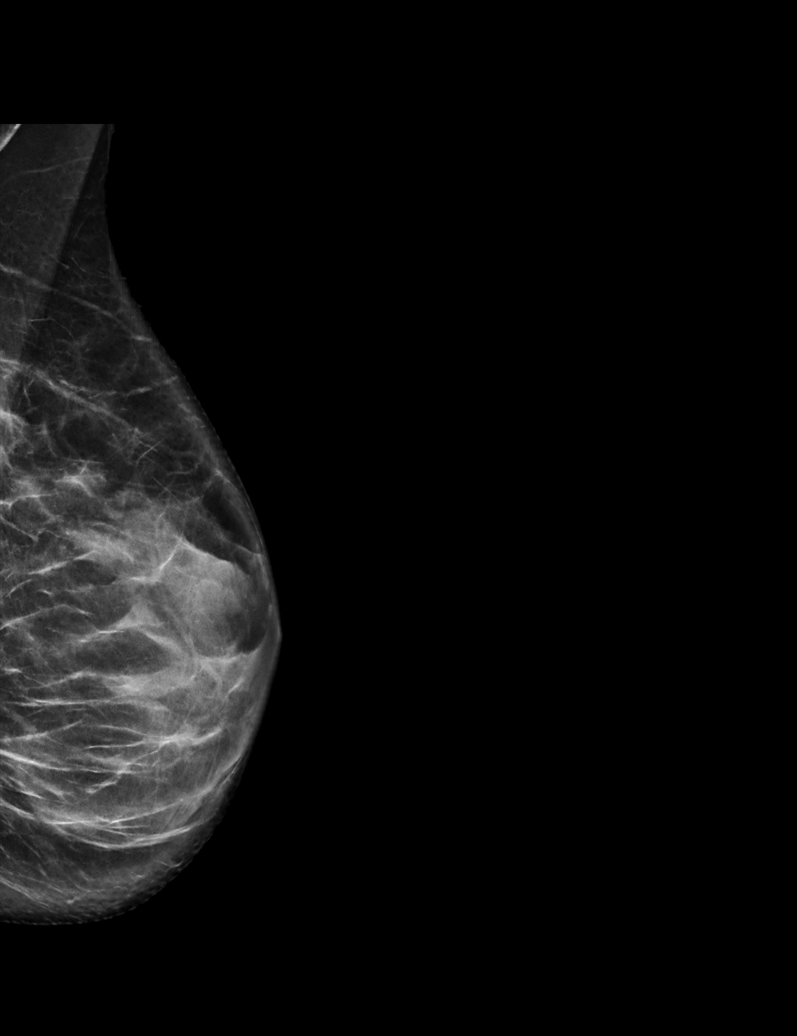

[R MLO synth-2D (1 of 2)]
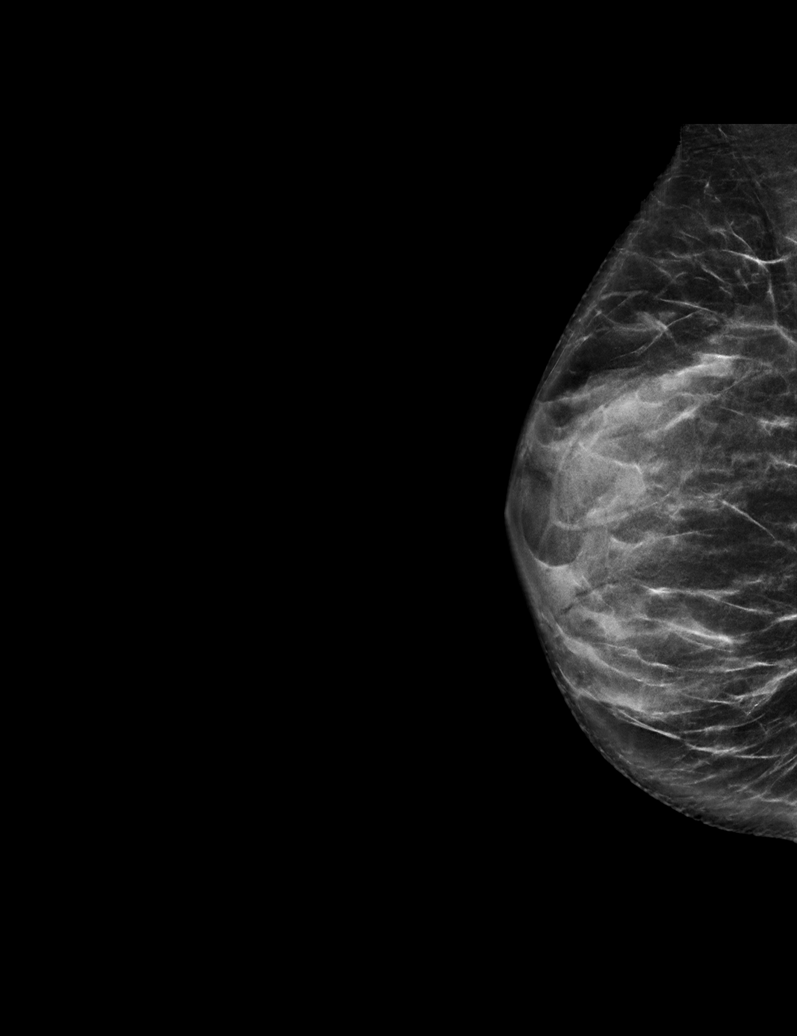

[R MLO synth-2D (2 of 2)]
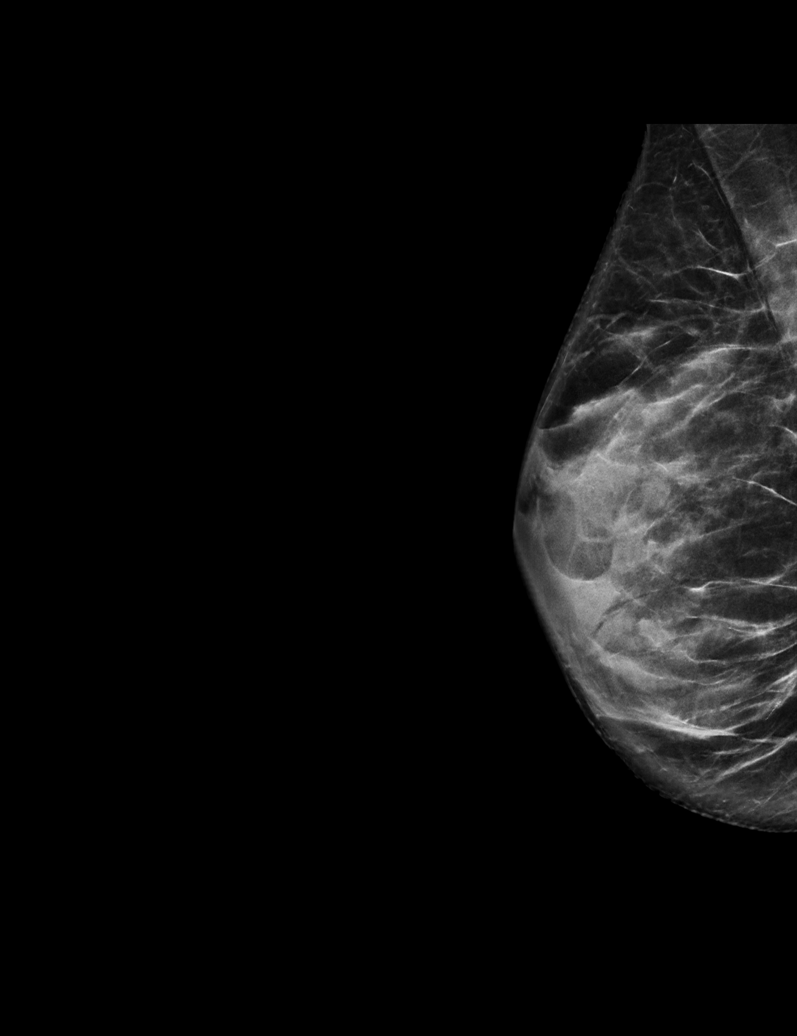

[L CC synth-2D]
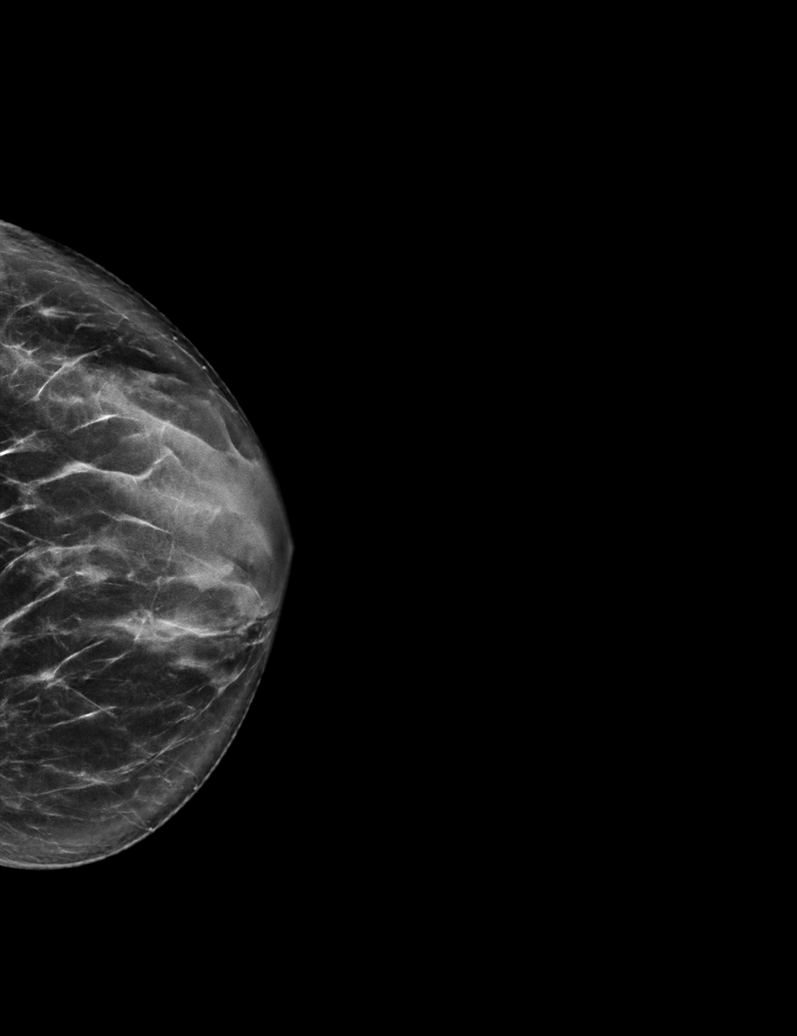

[R CC synth-2D]
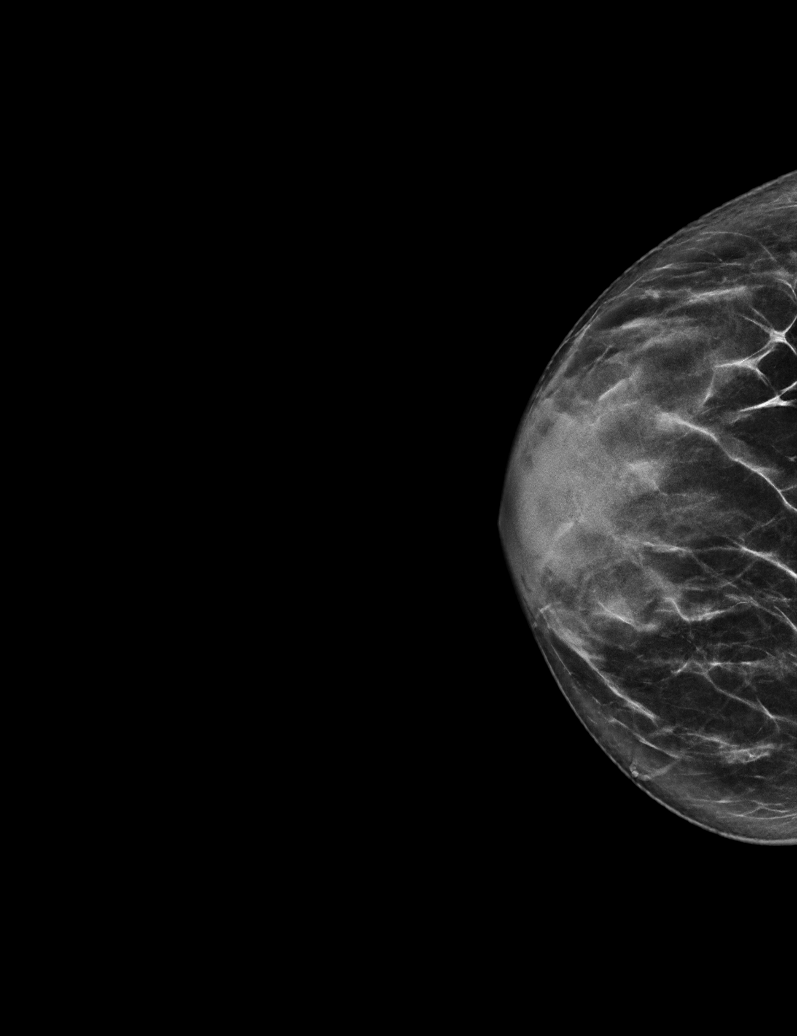

[L MLO tomo · tomo slice 37/72.0]
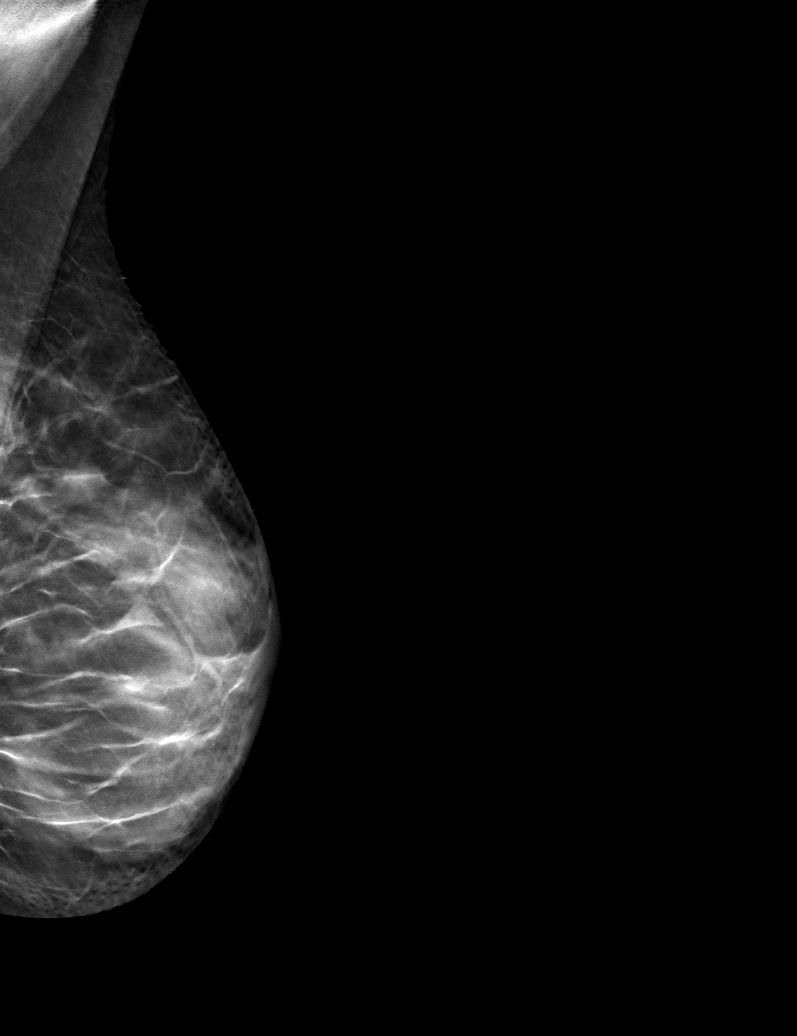

[6 of 30 positions shown; findings below may reference images not displayed]

ACR Breast Density Category c: The breast tissue is heterogeneously
dense, which may obscure small masses.
FINDINGS: There are no findings suspicious for malignancy.
IMPRESSION: No mammographic evidence of malignancy. A result letter of this
screening mammogram will be mailed directly to the patient.

RECOMMENDATION:
Screening mammogram in one year. (Code:Q3-W-BC3)

BI-RADS CATEGORY  1: Negative.

## 2022-12-15 ENCOUNTER — Other Ambulatory Visit (HOSPITAL_COMMUNITY): Payer: Self-pay

## 2022-12-30 ENCOUNTER — Telehealth: Payer: Self-pay

## 2022-12-30 ENCOUNTER — Ambulatory Visit: Payer: Medicare Other

## 2022-12-30 NOTE — Telephone Encounter (Signed)
Unsuccessful attempt X3 to reach patient on preferred number listed in notes for scheduled AWV. Left message on voicemail okay to rescedule.

## 2023-01-21 ENCOUNTER — Other Ambulatory Visit: Payer: Self-pay

## 2023-01-21 ENCOUNTER — Other Ambulatory Visit (HOSPITAL_COMMUNITY): Payer: Self-pay

## 2023-01-21 ENCOUNTER — Encounter: Payer: Self-pay | Admitting: Nurse Practitioner

## 2023-01-21 ENCOUNTER — Ambulatory Visit (INDEPENDENT_AMBULATORY_CARE_PROVIDER_SITE_OTHER): Payer: Medicare Other | Admitting: Nurse Practitioner

## 2023-01-21 DIAGNOSIS — Z8673 Personal history of transient ischemic attack (TIA), and cerebral infarction without residual deficits: Secondary | ICD-10-CM

## 2023-01-21 DIAGNOSIS — E7800 Pure hypercholesterolemia, unspecified: Secondary | ICD-10-CM | POA: Diagnosis not present

## 2023-01-21 DIAGNOSIS — I10 Essential (primary) hypertension: Secondary | ICD-10-CM

## 2023-01-21 DIAGNOSIS — E611 Iron deficiency: Secondary | ICD-10-CM | POA: Diagnosis not present

## 2023-01-21 MED ORDER — SIMVASTATIN 20 MG PO TABS
20.0000 mg | ORAL_TABLET | Freq: Every day | ORAL | 3 refills | Status: DC
  Filled 2023-01-21: qty 90, 90d supply, fill #0
  Filled 2023-05-02: qty 90, 90d supply, fill #1
  Filled 2023-08-01: qty 90, 90d supply, fill #2
  Filled 2023-10-24: qty 90, 90d supply, fill #3

## 2023-01-21 MED ORDER — ASPIRIN 325 MG PO TABS
325.0000 mg | ORAL_TABLET | Freq: Every day | ORAL | 11 refills | Status: AC
  Filled 2023-01-21: qty 30, 30d supply, fill #0

## 2023-01-21 MED ORDER — CALCIUM CARBONATE 600 MG PO TABS
600.0000 mg | ORAL_TABLET | Freq: Two times a day (BID) | ORAL | 2 refills | Status: AC
  Filled 2023-01-21: qty 30, 15d supply, fill #0

## 2023-01-21 MED ORDER — ALENDRONATE SODIUM 70 MG PO TABS
70.0000 mg | ORAL_TABLET | ORAL | 3 refills | Status: DC
  Filled 2023-01-21 – 2023-02-16 (×2): qty 12, 84d supply, fill #0
  Filled 2023-05-02: qty 12, 84d supply, fill #1
  Filled 2023-08-01: qty 12, 84d supply, fill #2
  Filled 2023-10-24: qty 12, 84d supply, fill #3

## 2023-01-21 MED ORDER — LISINOPRIL-HYDROCHLOROTHIAZIDE 20-12.5 MG PO TABS
1.0000 | ORAL_TABLET | Freq: Every day | ORAL | 3 refills | Status: DC
  Filled 2023-01-21 – 2023-03-14 (×2): qty 90, 90d supply, fill #0
  Filled 2023-06-14: qty 90, 90d supply, fill #1
  Filled 2023-09-13: qty 90, 90d supply, fill #2
  Filled 2023-12-14: qty 90, 90d supply, fill #3

## 2023-01-21 MED ORDER — IRON 325 (65 FE) MG PO TABS: 1.0000 | ORAL_TABLET | Freq: Every day | ORAL | 11 refills | Status: AC

## 2023-01-21 NOTE — Assessment & Plan Note (Signed)
-   aspirin 325 MG tablet; Take 1 tablet (325 mg total) by mouth daily.  Dispense: 30 tablet; Refill: 11  2. Iron deficiency  - Ferrous Sulfate (IRON) 325 (65 Fe) MG TABS; Take 1 tablet (325 mg total) by mouth daily.  Dispense: 90 tablet; Refill: 11  3. Essential hypertension  - lisinopril-hydrochlorothiazide (ZESTORETIC) 20-12.5 MG tablet; TAKE 1 TABLET BY MOUTH DAILY.  Dispense: 90 tablet; Refill: 3 - CBC - Comprehensive metabolic panel  4. Pure hypercholesterolemia  - simvastatin (ZOCOR) 20 MG tablet; Take 1 tablet (20 mg total) by mouth at bedtime.  Dispense: 90 tablet; Refill: 3 - Lipid Panel   Follow up:  Follow up in 3 months

## 2023-01-21 NOTE — Progress Notes (Signed)
@Patient  ID: Angela [name], female    DOB: 02/11/1951, 72 y.o.   MRN: [id number]  Chief Complaint  Patient presents with   Hypertension    Follow up    Referring provider: No ref. provider found   HPI  [facility] presents for follow up. She  has a past medical history of Anemia, Hyperlipidemia, Hypertension, and TIA (transient ischemic attack) (2004).    Angela [name] is in today for follow up for Hypertension. The current prescribed treatment is Zestoretic  Compliance is reported and home blood pressure monitoring is done at home. The  DASH diet is being followed. An exercise regimen is ongoing. There is a goal to get off medications if possible. Denies headache, dizziness, visual changes, shortness of breath, dyspnea on exertion, chest pain, nausea, vomiting or any edema.      No Known Allergies  Immunization History  Administered Date(s) Administered   PFIZER(Purple Top)SARS-COV-2 Vaccination 01/27/2020, 02/02/2020, 02/13/2020   PPD Test 12/11/2013   Pneumococcal Conjugate-13 01/04/2018    Past Medical History:  Diagnosis Date   Anemia    Hyperlipidemia    Hypertension    TIA (transient ischemic attack) 2004    Tobacco History: Social History   Tobacco Use  Smoking Status Never  Smokeless Tobacco Never   Counseling given: Not Answered   Outpatient Encounter Medications as of 01/21/2023  Medication Sig   Multiple Vitamin (MULTIVITAMIN) tablet Take 1 tablet by mouth daily.   [DISCONTINUED] alendronate (FOSAMAX) 70 MG tablet TAKE 1 TABLET BY MOUTH ONCE WEEKLY ON AN EMPTY STOMACH WITH A FULL GLASS OF WATER   [DISCONTINUED] aspirin 325 MG tablet Take 1 tablet (325 mg total) by mouth daily.   [DISCONTINUED] calcium carbonate (OS-CAL) 600 MG TABS Take 600 mg by mouth 2 (two) times daily with a meal.   [DISCONTINUED] Ferrous Sulfate (IRON) 325 (65 Fe) MG TABS Take 1 tablet (325 mg total) by mouth daily. (Patient taking differently: Take 1 tablet by mouth daily.  Pt is taking 1 three times a day)   [DISCONTINUED] lisinopril-hydrochlorothiazide (ZESTORETIC) 20-12.5 MG tablet TAKE 1 TABLET BY MOUTH DAILY.   [DISCONTINUED] simvastatin (ZOCOR) 20 MG tablet Take 1 tablet (20 mg total) by mouth at bedtime.   alendronate (FOSAMAX) 70 MG tablet TAKE 1 TABLET BY MOUTH ONCE WEEKLY ON AN EMPTY STOMACH WITH A FULL GLASS OF WATER   aspirin 325 MG tablet Take 1 tablet (325 mg total) by mouth daily.   calcium carbonate (OS-CAL) 600 MG TABS tablet Take 1 tablet (600 mg total) by mouth 2 (two) times daily with a meal.   Ferrous Sulfate (IRON) 325 (65 Fe) MG TABS Take 1 tablet (325 mg total) by mouth daily.   lisinopril-hydrochlorothiazide (ZESTORETIC) 20-12.5 MG tablet TAKE 1 TABLET BY MOUTH DAILY.   simvastatin (ZOCOR) 20 MG tablet Take 1 tablet (20 mg total) by mouth at bedtime.   No facility-administered encounter medications on file as of 01/21/2023.     Review of Systems  Review of Systems  Constitutional: Negative.   HENT: Negative.    Cardiovascular: Negative.   Gastrointestinal: Negative.   Allergic/Immunologic: Negative.   Neurological: Negative.   Psychiatric/Behavioral: Negative.         Physical Exam  BP 136/63   Pulse 73   Temp (!) 97.5 F (36.4 C)   Ht 5\' 2"  (1.575 m)   Wt 103 lb 12.8 oz (47.1 kg)   SpO2 100%   BMI 18.99 kg/m   Wt Readings from Last 5  Encounters:  01/21/23 103 lb 12.8 oz (47.1 kg)  07/23/22 104 lb 9.6 oz (47.4 kg)  01/21/22 101 lb 9.6 oz (46.1 kg)  07/22/21 101 lb (45.8 kg)  10/10/20 102 lb (46.3 kg)     Physical Exam Vitals and nursing note reviewed.  Constitutional:      General: She is not in acute distress.    Appearance: She is well-developed.  Cardiovascular:     Rate and Rhythm: Normal rate and regular rhythm.  Pulmonary:     Effort: Pulmonary effort is normal.     Breath sounds: Normal breath sounds.  Neurological:     Mental Status: She is alert and oriented to person, place, and time.        Assessment & Plan:   Hx-TIA (transient ischemic attack) - aspirin 325 MG tablet; Take 1 tablet (325 mg total) by mouth daily.  Dispense: 30 tablet; Refill: 11  2. Iron deficiency  - Ferrous Sulfate (IRON) 325 (65 Fe) MG TABS; Take 1 tablet (325 mg total) by mouth daily.  Dispense: 90 tablet; Refill: 11  3. Essential hypertension  - lisinopril-hydrochlorothiazide (ZESTORETIC) 20-12.5 MG tablet; TAKE 1 TABLET BY MOUTH DAILY.  Dispense: 90 tablet; Refill: 3 - CBC - Comprehensive metabolic panel  4. Pure hypercholesterolemia  - simvastatin (ZOCOR) 20 MG tablet; Take 1 tablet (20 mg total) by mouth at bedtime.  Dispense: 90 tablet; Refill: 3 - Lipid Panel   Follow up:  Follow up in 3 months      Fenton Foy, NP 01/21/2023

## 2023-01-21 NOTE — Patient Instructions (Addendum)
1. Hx-TIA (transient ischemic attack)  - aspirin 325 MG tablet; Take 1 tablet (325 mg total) by mouth daily.  Dispense: 30 tablet; Refill: 11  2. Iron deficiency  - Ferrous Sulfate (IRON) 325 (65 Fe) MG TABS; Take 1 tablet (325 mg total) by mouth daily.  Dispense: 90 tablet; Refill: 11  3. Essential hypertension  - lisinopril-hydrochlorothiazide (ZESTORETIC) 20-12.5 MG tablet; TAKE 1 TABLET BY MOUTH DAILY.  Dispense: 90 tablet; Refill: 3 - CBC - Comprehensive metabolic panel  4. Pure hypercholesterolemia  - simvastatin (ZOCOR) 20 MG tablet; Take 1 tablet (20 mg total) by mouth at bedtime.  Dispense: 90 tablet; Refill: 3 - Lipid Panel   Follow up:  Follow up in 3 months

## 2023-01-22 LAB — LIPID PANEL
Chol/HDL Ratio: 2.8 ratio (ref 0.0–4.4)
Cholesterol, Total: 173 mg/dL (ref 100–199)
HDL: 61 mg/dL (ref >39)
LDL Chol Calc (NIH): 100 mg/dL — ABNORMAL HIGH (ref 0–99)
Triglycerides: 62 mg/dL (ref 0–149)
VLDL Cholesterol Cal: 12 mg/dL (ref 5–40)

## 2023-01-22 LAB — CBC
Hematocrit: 35.3 % (ref 34.0–46.6)
Hemoglobin: 11.3 g/dL (ref 11.1–15.9)
MCH: 28.5 pg (ref 26.6–33.0)
MCHC: 32 g/dL (ref 31.5–35.7)
MCV: 89 fL (ref 79–97)
Platelets: 305 10*3/uL (ref 150–450)
RBC: 3.97 x10E6/uL (ref 3.77–5.28)
RDW: 12.9 % (ref 11.7–15.4)
WBC: 5.1 10*3/uL (ref 3.4–10.8)

## 2023-01-22 LAB — COMPREHENSIVE METABOLIC PANEL
ALT: 14 IU/L (ref 0–32)
AST: 24 IU/L (ref 0–40)
Albumin/Globulin Ratio: 1.4 (ref 1.2–2.2)
Albumin: 4.2 g/dL (ref 3.9–4.9)
Alkaline Phosphatase: 82 IU/L (ref 44–121)
BUN/Creatinine Ratio: 21 (ref 12–28)
BUN: 22 mg/dL (ref 8–27)
Bilirubin Total: <0.2 mg/dL (ref 0.0–1.2)
CO2: 26 mmol/L (ref 20–29)
Calcium: 10.2 mg/dL (ref 8.7–10.3)
Chloride: 103 mmol/L (ref 96–106)
Creatinine, Ser: 1.04 mg/dL — ABNORMAL HIGH (ref 0.57–1.00)
Globulin, Total: 2.9 g/dL (ref 1.5–4.5)
Glucose: 93 mg/dL (ref 70–99)
Potassium: 4.4 mmol/L (ref 3.5–5.2)
Sodium: 144 mmol/L (ref 134–144)
Total Protein: 7.1 g/dL (ref 6.0–8.5)
eGFR: 58 mL/min/{1.73_m2} — ABNORMAL LOW (ref >59)

## 2023-02-14 ENCOUNTER — Telehealth: Payer: Self-pay | Admitting: Nurse Practitioner

## 2023-02-14 NOTE — Telephone Encounter (Signed)
Contacted Angela [name] to schedule their annual wellness visit. Appointment made for 02/17/2023.  Thank you,  [facility] Support [facility] Direct dial  [phone]

## 2023-02-16 ENCOUNTER — Other Ambulatory Visit: Payer: Self-pay

## 2023-02-16 ENCOUNTER — Other Ambulatory Visit (HOSPITAL_COMMUNITY): Payer: Self-pay

## 2023-02-17 ENCOUNTER — Telehealth: Payer: Self-pay

## 2023-02-17 NOTE — Telephone Encounter (Signed)
Unsuccessful attempt to reach patient on preferred number listed in notes for scheduled AWV. Left message on voicemail okay to reschedule. 

## 2023-03-08 ENCOUNTER — Other Ambulatory Visit: Payer: Self-pay | Admitting: Nurse Practitioner

## 2023-03-08 ENCOUNTER — Telehealth: Payer: Self-pay | Admitting: Nurse Practitioner

## 2023-03-08 DIAGNOSIS — Z1231 Encounter for screening mammogram for malignant neoplasm of breast: Secondary | ICD-10-CM

## 2023-03-08 NOTE — Telephone Encounter (Signed)
Called patient to schedule Medicare Annual Wellness Visit (AWV). Left message for patient to call back and schedule Medicare Annual Wellness Visit (AWV).   Please schedule an appointment at any time with Abby, NHA. .  If any questions, please contact me at [phone].  Thank you,  Stephanie,  [facility] Support [facility] AWV Program Direct Dial ??[phone]    

## 2023-03-14 ENCOUNTER — Other Ambulatory Visit: Payer: Self-pay

## 2023-03-14 ENCOUNTER — Other Ambulatory Visit (HOSPITAL_COMMUNITY): Payer: Self-pay

## 2023-03-31 ENCOUNTER — Telehealth: Payer: Self-pay

## 2023-03-31 NOTE — Telephone Encounter (Signed)
 Called patient to try and complete missed AWVI appt from 02/17/23. No answer. Left vm asking patient to call office to reschedule that appt. OK to schedule w/  next available

## 2023-05-02 ENCOUNTER — Other Ambulatory Visit (HOSPITAL_COMMUNITY): Payer: Self-pay

## 2023-05-20 ENCOUNTER — Ambulatory Visit
Admission: RE | Admit: 2023-05-20 | Discharge: 2023-05-20 | Disposition: A | Discharge status: Home / Self Care | Payer: Medicare Other | Source: Ambulatory Visit | Attending: Nurse Practitioner | Admitting: Nurse Practitioner

## 2023-05-20 DIAGNOSIS — Z1231 Encounter for screening mammogram for malignant neoplasm of breast: Secondary | ICD-10-CM

## 2023-06-14 ENCOUNTER — Other Ambulatory Visit (HOSPITAL_COMMUNITY): Payer: Self-pay

## 2023-07-25 ENCOUNTER — Ambulatory Visit (INDEPENDENT_AMBULATORY_CARE_PROVIDER_SITE_OTHER): Payer: Medicare Other | Admitting: Nurse Practitioner

## 2023-07-25 ENCOUNTER — Encounter: Payer: Self-pay | Admitting: Nurse Practitioner

## 2023-07-25 VITALS — BP 141/68 | HR 65 | Temp 98.4°F | Ht 62.5 in | Wt 105.0 lb

## 2023-07-25 DIAGNOSIS — I10 Essential (primary) hypertension: Secondary | ICD-10-CM

## 2023-07-25 NOTE — Patient Instructions (Signed)
1. Essential hypertension  - CBC - Comprehensive metabolic panel   Follow up:  Follow up in 6 months

## 2023-07-25 NOTE — Progress Notes (Addendum)
Subjective   Patient ID: Angela [name], female    DOB: 1951-07-02, 72 y.o.   MRN: [id number]  Chief Complaint  Patient presents with   Medical Management of Chronic Issues    Referring provider: Ivonne Andrew, NP  Angela [name] is a 72 y.o. female with Past Medical History: No date: Anemia No date: Hyperlipidemia No date: Hypertension 2004: TIA (transient ischemic attack)   HPI   Angela [name] is in today for follow up for Hypertension. The current prescribed treatment is Zestoretic  Compliance is reported and home blood pressure monitoring is done at home. The  DASH diet is being followed. An exercise regimen is ongoing. There is a goal to get off medications if possible. No new issues or concerns today. Denies headache, dizziness, visual changes, shortness of breath, dyspnea on exertion, chest pain, nausea, vomiting or any edema.       No Known Allergies  Immunization History  Administered Date(s) Administered   PFIZER(Purple Top)SARS-COV-2 Vaccination 01/27/2020, 02/02/2020, 02/13/2020   PPD Test 12/11/2013   Pneumococcal Conjugate-13 01/04/2018    Tobacco History: Social History   Tobacco Use  Smoking Status Never  Smokeless Tobacco Never   Counseling given: Not Answered   Outpatient Encounter Medications as of 07/25/2023  Medication Sig   alendronate (FOSAMAX) 70 MG tablet Take 1 tablet (70 mg total) by mouth once a week on an empty stomach with a full glass of water.   aspirin 325 MG tablet Take 1 tablet (325 mg total) by mouth daily.   calcium carbonate (OS-CAL) 600 MG TABS tablet Take 1 tablet (600 mg total) by mouth 2 (two) times daily with a meal.   Ferrous Sulfate (IRON) 325 (65 Fe) MG TABS Take 1 tablet (325 mg total) by mouth daily.   lisinopril-hydrochlorothiazide (ZESTORETIC) 20-12.5 MG tablet Take 1 tablet by mouth daily.   Multiple Vitamin (MULTIVITAMIN) tablet Take 1 tablet by mouth daily.   simvastatin (ZOCOR) 20 MG tablet Take 1 tablet  (20 mg total) by mouth at bedtime.   No facility-administered encounter medications on file as of 07/25/2023.    Review of Systems  Review of Systems  Constitutional: Negative.   HENT: Negative.    Cardiovascular: Negative.   Gastrointestinal: Negative.   Allergic/Immunologic: Negative.   Neurological: Negative.   Psychiatric/Behavioral: Negative.       Objective:   BP (!) 141/68   Pulse 65   Temp 98.4 F (36.9 C) (Oral)   Ht 5' 2.5" (1.588 m)   Wt 105 lb (47.6 kg)   SpO2 100%   BMI 18.90 kg/m   Wt Readings from Last 5 Encounters:  07/25/23 105 lb (47.6 kg)  01/21/23 103 lb 12.8 oz (47.1 kg)  07/23/22 104 lb 9.6 oz (47.4 kg)  01/21/22 101 lb 9.6 oz (46.1 kg)  07/22/21 101 lb (45.8 kg)     Physical Exam Vitals and nursing note reviewed.  Constitutional:      General: She is not in acute distress.    Appearance: She is well-developed.  Cardiovascular:     Rate and Rhythm: Normal rate and regular rhythm.  Pulmonary:     Effort: Pulmonary effort is normal.     Breath sounds: Normal breath sounds.  Neurological:     Mental Status: She is alert and oriented to person, place, and time.       Assessment & Plan:   Essential hypertension -     CBC -     Comprehensive metabolic panel  Return in about 6 months (around 01/22/2024).   Ivonne Andrew, NP 07/25/2023

## 2023-07-25 NOTE — Progress Notes (Signed)
Patient states no concerns.

## 2023-07-26 LAB — CBC
Hematocrit: 35.4 % (ref 34.0–46.6)
Hemoglobin: 11.7 g/dL (ref 11.1–15.9)
MCH: 29.8 pg (ref 26.6–33.0)
MCHC: 33.1 g/dL (ref 31.5–35.7)
MCV: 90 fL (ref 79–97)
Platelets: 242 10*3/uL (ref 150–450)
RBC: 3.92 x10E6/uL (ref 3.77–5.28)
RDW: 12 % (ref 11.7–15.4)
WBC: 5.4 10*3/uL (ref 3.4–10.8)

## 2023-07-26 LAB — COMPREHENSIVE METABOLIC PANEL
ALT: 14 IU/L (ref 0–32)
AST: 23 IU/L (ref 0–40)
Albumin: 4.1 g/dL (ref 3.9–4.9)
Alkaline Phosphatase: 74 IU/L (ref 44–121)
BUN/Creatinine Ratio: 27 (ref 12–28)
BUN: 25 mg/dL (ref 8–27)
Bilirubin Total: <0.2 mg/dL (ref 0.0–1.2)
CO2: 23 mmol/L (ref 20–29)
Calcium: 9.4 mg/dL (ref 8.7–10.3)
Chloride: 104 mmol/L (ref 96–106)
Creatinine, Ser: 0.93 mg/dL (ref 0.57–1.00)
Globulin, Total: 3.2 g/dL (ref 1.5–4.5)
Glucose: 101 mg/dL — ABNORMAL HIGH (ref 70–99)
Potassium: 4.5 mmol/L (ref 3.5–5.2)
Sodium: 143 mmol/L (ref 134–144)
Total Protein: 7.3 g/dL (ref 6.0–8.5)
eGFR: 66 mL/min/{1.73_m2} (ref >59)

## 2023-08-01 ENCOUNTER — Other Ambulatory Visit (HOSPITAL_COMMUNITY): Payer: Self-pay

## 2023-09-13 ENCOUNTER — Other Ambulatory Visit (HOSPITAL_COMMUNITY): Payer: Self-pay

## 2023-10-24 ENCOUNTER — Other Ambulatory Visit (HOSPITAL_COMMUNITY): Payer: Self-pay

## 2023-12-14 ENCOUNTER — Other Ambulatory Visit (HOSPITAL_COMMUNITY): Payer: Self-pay

## 2024-01-17 ENCOUNTER — Other Ambulatory Visit (HOSPITAL_COMMUNITY): Payer: Self-pay

## 2024-01-17 ENCOUNTER — Other Ambulatory Visit: Payer: Self-pay

## 2024-01-17 ENCOUNTER — Other Ambulatory Visit: Payer: Self-pay | Admitting: Nurse Practitioner

## 2024-01-17 MED ORDER — ALENDRONATE SODIUM 70 MG PO TABS
70.0000 mg | ORAL_TABLET | ORAL | 3 refills | Status: DC
  Filled 2024-01-17: qty 12, 84d supply, fill #0
  Filled 2024-04-05: qty 12, 84d supply, fill #1
  Filled 2024-06-18: qty 12, 84d supply, fill #2

## 2024-01-23 ENCOUNTER — Ambulatory Visit: Payer: Self-pay | Admitting: Nurse Practitioner

## 2024-02-01 ENCOUNTER — Other Ambulatory Visit: Payer: Self-pay

## 2024-02-01 ENCOUNTER — Other Ambulatory Visit (HOSPITAL_COMMUNITY): Payer: Self-pay

## 2024-02-01 ENCOUNTER — Other Ambulatory Visit: Payer: Self-pay | Admitting: Nurse Practitioner

## 2024-02-01 DIAGNOSIS — E7800 Pure hypercholesterolemia, unspecified: Secondary | ICD-10-CM

## 2024-02-01 MED ORDER — SIMVASTATIN 20 MG PO TABS
20.0000 mg | ORAL_TABLET | Freq: Every day | ORAL | 3 refills | Status: DC
  Filled 2024-02-01: qty 90, 90d supply, fill #0
  Filled 2024-05-04: qty 90, 90d supply, fill #1
  Filled 2024-08-06: qty 90, 90d supply, fill #2

## 2024-02-10 ENCOUNTER — Encounter: Payer: Self-pay | Admitting: Nurse Practitioner

## 2024-02-10 ENCOUNTER — Ambulatory Visit (INDEPENDENT_AMBULATORY_CARE_PROVIDER_SITE_OTHER): Payer: Self-pay | Admitting: Nurse Practitioner

## 2024-02-10 VITALS — BP 139/49 | HR 70 | Temp 97.7°F | Wt 103.4 lb

## 2024-02-10 DIAGNOSIS — Z13220 Encounter for screening for lipoid disorders: Secondary | ICD-10-CM

## 2024-02-10 DIAGNOSIS — I10 Essential (primary) hypertension: Secondary | ICD-10-CM

## 2024-02-10 DIAGNOSIS — Z1329 Encounter for screening for other suspected endocrine disorder: Secondary | ICD-10-CM

## 2024-02-10 NOTE — Progress Notes (Signed)
 Subjective   Patient ID: Angela [name], female    DOB: Feb 13, 1951, 73 y.o.   MRN: [id number]  Chief Complaint  Patient presents with   Medical Management of Chronic Issues    Referring provider: Ivonne Andrew, NP  Angela [name] is a 73 y.o. female with Past Medical History: No date: Anemia No date: Hyperlipidemia No date: Hypertension 2004: TIA (transient ischemic attack)   HPI  [name] is in today for follow up for Hypertension. The current prescribed treatment is Zestoretic  Compliance is reported and home blood pressure monitoring is done at home. The  DASH diet is being followed. An exercise regimen is ongoing. There is a goal to get off medications if possible. No new issues or concerns today. Denies headache, dizziness, visual changes, shortness of breath, dyspnea on exertion, chest pain, nausea, vomiting or any edema.       No Known Allergies  Immunization History  Administered Date(s) Administered   PFIZER(Purple Top)SARS-COV-2 Vaccination 01/27/2020, 02/02/2020, 02/13/2020   PPD Test 12/11/2013   Pneumococcal Conjugate-13 01/04/2018    Tobacco History: Social History   Tobacco Use  Smoking Status Never  Smokeless Tobacco Never   Counseling given: Not Answered   Outpatient Encounter Medications as of 02/10/2024  Medication Sig   aspirin 325 MG tablet Take 1 tablet (325 mg total) by mouth daily.   calcium carbonate (OS-CAL) 600 MG TABS tablet Take 1 tablet (600 mg total) by mouth 2 (two) times daily with a meal.   Ferrous Sulfate (IRON) 325 (65 Fe) MG TABS Take 1 tablet (325 mg total) by mouth daily.   lisinopril-hydrochlorothiazide (ZESTORETIC) 20-12.5 MG tablet Take 1 tablet by mouth daily.   Multiple Vitamin (MULTIVITAMIN) tablet Take 1 tablet by mouth daily.   simvastatin (ZOCOR) 20 MG tablet Take 1 tablet (20 mg total) by mouth at bedtime.   alendronate (FOSAMAX) 70 MG tablet Take 1 tablet (70 mg total) by mouth once a week on an empty  stomach with a full glass of water. (Patient not taking: Reported on 02/10/2024)   No facility-administered encounter medications on file as of 02/10/2024.    Review of Systems  Review of Systems  Constitutional: Negative.   HENT: Negative.    Cardiovascular: Negative.   Gastrointestinal: Negative.   Allergic/Immunologic: Negative.   Neurological: Negative.   Psychiatric/Behavioral: Negative.       Objective:   BP (!) 139/49   Pulse 70   Temp 97.7 F (36.5 C) (Oral)   Wt 103 lb 6.4 oz (46.9 kg)   SpO2 99%   BMI 18.61 kg/m   Wt Readings from Last 5 Encounters:  02/10/24 103 lb 6.4 oz (46.9 kg)  07/25/23 105 lb (47.6 kg)  01/21/23 103 lb 12.8 oz (47.1 kg)  07/23/22 104 lb 9.6 oz (47.4 kg)  01/21/22 101 lb 9.6 oz (46.1 kg)     Physical Exam Vitals and nursing note reviewed.  Constitutional:      General: She is not in acute distress.    Appearance: She is well-developed.  Cardiovascular:     Rate and Rhythm: Normal rate and regular rhythm.  Pulmonary:     Effort: Pulmonary effort is normal.     Breath sounds: Normal breath sounds.  Neurological:     Mental Status: She is alert and oriented to person, place, and time.       Assessment & Plan:   Thyroid disorder screen -     TSH  Lipid screening -  Lipid panel  Essential hypertension -     CBC -     Comprehensive metabolic panel with GFR     Return in about 6 months (around 08/11/2024).   Ivonne Andrew, NP 02/10/2024

## 2024-02-11 LAB — COMPREHENSIVE METABOLIC PANEL WITH GFR
ALT: 17 IU/L (ref 0–32)
AST: 27 IU/L (ref 0–40)
Albumin: 4.3 g/dL (ref 3.8–4.8)
Alkaline Phosphatase: 79 IU/L (ref 44–121)
BUN/Creatinine Ratio: 22 (ref 12–28)
BUN: 22 mg/dL (ref 8–27)
Bilirubin Total: 0.2 mg/dL (ref 0.0–1.2)
CO2: 25 mmol/L (ref 20–29)
Calcium: 9.6 mg/dL (ref 8.7–10.3)
Chloride: 103 mmol/L (ref 96–106)
Creatinine, Ser: 0.98 mg/dL (ref 0.57–1.00)
Globulin, Total: 2.8 g/dL (ref 1.5–4.5)
Glucose: 92 mg/dL (ref 70–99)
Potassium: 4.5 mmol/L (ref 3.5–5.2)
Sodium: 141 mmol/L (ref 134–144)
Total Protein: 7.1 g/dL (ref 6.0–8.5)
eGFR: 62 mL/min/{1.73_m2} (ref >59)

## 2024-02-11 LAB — CBC
Hematocrit: 38.6 % (ref 34.0–46.6)
Hemoglobin: 12 g/dL (ref 11.1–15.9)
MCH: 27.6 pg (ref 26.6–33.0)
MCHC: 31.1 g/dL — ABNORMAL LOW (ref 31.5–35.7)
MCV: 89 fL (ref 79–97)
Platelets: 307 10*3/uL (ref 150–450)
RBC: 4.34 x10E6/uL (ref 3.77–5.28)
RDW: 13.1 % (ref 11.7–15.4)
WBC: 5.9 10*3/uL (ref 3.4–10.8)

## 2024-02-11 LAB — LIPID PANEL
Chol/HDL Ratio: 3 ratio (ref 0.0–4.4)
Cholesterol, Total: 172 mg/dL (ref 100–199)
HDL: 58 mg/dL (ref >39)
LDL Chol Calc (NIH): 100 mg/dL — ABNORMAL HIGH (ref 0–99)
Triglycerides: 76 mg/dL (ref 0–149)
VLDL Cholesterol Cal: 14 mg/dL (ref 5–40)

## 2024-02-11 LAB — TSH: TSH: 2.63 u[IU]/mL (ref 0.450–4.500)

## 2024-02-14 ENCOUNTER — Other Ambulatory Visit: Payer: Self-pay | Admitting: Nurse Practitioner

## 2024-02-14 DIAGNOSIS — Z1231 Encounter for screening mammogram for malignant neoplasm of breast: Secondary | ICD-10-CM

## 2024-02-15 DIAGNOSIS — H524 Presbyopia: Secondary | ICD-10-CM | POA: Diagnosis not present

## 2024-03-16 DIAGNOSIS — K080 Exfoliation of teeth due to systemic causes: Secondary | ICD-10-CM | POA: Diagnosis not present

## 2024-03-19 ENCOUNTER — Other Ambulatory Visit: Payer: Self-pay | Admitting: Nurse Practitioner

## 2024-03-19 ENCOUNTER — Other Ambulatory Visit (HOSPITAL_COMMUNITY): Payer: Self-pay

## 2024-03-19 DIAGNOSIS — I10 Essential (primary) hypertension: Secondary | ICD-10-CM

## 2024-03-20 ENCOUNTER — Other Ambulatory Visit (HOSPITAL_COMMUNITY): Payer: Self-pay

## 2024-03-20 ENCOUNTER — Other Ambulatory Visit: Payer: Self-pay

## 2024-03-20 MED ORDER — LISINOPRIL-HYDROCHLOROTHIAZIDE 20-12.5 MG PO TABS
1.0000 | ORAL_TABLET | Freq: Every day | ORAL | 3 refills | Status: DC
  Filled 2024-03-20: qty 90, 90d supply, fill #0
  Filled 2024-06-18: qty 90, 90d supply, fill #1

## 2024-04-05 ENCOUNTER — Other Ambulatory Visit (HOSPITAL_COMMUNITY): Payer: Self-pay

## 2024-04-08 DIAGNOSIS — H6123 Impacted cerumen, bilateral: Secondary | ICD-10-CM | POA: Diagnosis not present

## 2024-05-04 ENCOUNTER — Other Ambulatory Visit (HOSPITAL_COMMUNITY): Payer: Self-pay

## 2024-05-21 ENCOUNTER — Ambulatory Visit
Admission: RE | Admit: 2024-05-21 | Discharge: 2024-05-21 | Disposition: A | Discharge status: Home / Self Care | Source: Ambulatory Visit | Attending: Nurse Practitioner | Admitting: Nurse Practitioner

## 2024-05-21 DIAGNOSIS — Z1231 Encounter for screening mammogram for malignant neoplasm of breast: Secondary | ICD-10-CM | POA: Diagnosis not present

## 2024-06-18 ENCOUNTER — Other Ambulatory Visit (HOSPITAL_COMMUNITY): Payer: Self-pay

## 2024-08-06 ENCOUNTER — Other Ambulatory Visit (HOSPITAL_COMMUNITY): Payer: Self-pay

## 2024-08-13 ENCOUNTER — Other Ambulatory Visit (HOSPITAL_COMMUNITY): Payer: Self-pay

## 2024-08-13 ENCOUNTER — Encounter: Payer: Self-pay | Admitting: Nurse Practitioner

## 2024-08-13 ENCOUNTER — Ambulatory Visit (INDEPENDENT_AMBULATORY_CARE_PROVIDER_SITE_OTHER): Payer: Self-pay | Admitting: Nurse Practitioner

## 2024-08-13 VITALS — BP 134/64 | HR 64 | Resp 16 | Wt 98.8 lb

## 2024-08-13 DIAGNOSIS — D229 Melanocytic nevi, unspecified: Secondary | ICD-10-CM

## 2024-08-13 DIAGNOSIS — I10 Essential (primary) hypertension: Secondary | ICD-10-CM

## 2024-08-13 DIAGNOSIS — E7800 Pure hypercholesterolemia, unspecified: Secondary | ICD-10-CM

## 2024-08-13 MED ORDER — SIMVASTATIN 20 MG PO TABS
20.0000 mg | ORAL_TABLET | Freq: Every day | ORAL | 3 refills | Status: AC
  Filled 2024-08-13 – 2024-11-12 (×2): qty 90, 90d supply, fill #0

## 2024-08-13 MED ORDER — LISINOPRIL-HYDROCHLOROTHIAZIDE 20-12.5 MG PO TABS
1.0000 | ORAL_TABLET | Freq: Every day | ORAL | 3 refills | Status: AC
  Filled 2024-08-13 – 2024-09-20 (×2): qty 90, 90d supply, fill #0

## 2024-08-13 MED ORDER — ALENDRONATE SODIUM 70 MG PO TABS
70.0000 mg | ORAL_TABLET | ORAL | 3 refills | Status: AC
  Filled 2024-08-13 – 2024-09-20 (×2): qty 12, 84d supply, fill #0
  Filled 2024-12-11: qty 12, 84d supply, fill #1

## 2024-08-13 NOTE — Progress Notes (Signed)
 Subjective   Patient ID: Angela [name], female    DOB: 1950/11/10, 73 y.o.   MRN: [id number]  Chief Complaint  Patient presents with   Hypertension    6 month f/u    Nevus    Located on her back  Pt states it has been there and it was a like a little bump and it started to get irritated and she started scratching it     Referring provider: Oley Bascom RAMAN, NP  Angela [name] is a 73 y.o. female with Past Medical History: No date: Anemia No date: Hyperlipidemia No date: Hypertension 2004: TIA (transient ischemic attack)   HPI  Angela [name] is in today for follow up for Hypertension. The current prescribed treatment is Zestoretic   Compliance is reported and home blood pressure monitoring is done at home. The  DASH diet is being followed. An exercise regimen is ongoing. There is a goal to get off medications if possible. No new issues or concerns today. Denies headache, dizziness, visual changes, shortness of breath, dyspnea on exertion, chest pain, nausea, vomiting or any edema.   Patient complains of a long to her mid lower back that has changed in size and is now itching.  He does have a slight rash around it.  She states that she did put a Band-Aid on it and thinks that irritated her skin.  We will place a referral to dermatology for further evaluation and treatment.    No Known Allergies  Immunization History  Administered Date(s) Administered   PFIZER(Purple Top)SARS-COV-2 Vaccination 01/27/2020, 02/02/2020, 02/13/2020   PPD Test 12/11/2013   Pneumococcal Conjugate-13 01/04/2018    Tobacco History: Social History   Tobacco Use  Smoking Status Never  Smokeless Tobacco Never   Counseling given: Not Answered   Outpatient Encounter Medications as of 08/13/2024  Medication Sig   aspirin  325 MG tablet Take 1 tablet (325 mg total) by mouth daily.   calcium  carbonate (OS-CAL) 600 MG TABS tablet Take 1 tablet (600 mg total) by mouth 2 (two) times daily with a meal.    Ferrous Sulfate (IRON ) 325 (65 Fe) MG TABS Take 1 tablet (325 mg total) by mouth daily.   Multiple Vitamin (MULTIVITAMIN) tablet Take 1 tablet by mouth daily.   [DISCONTINUED] lisinopril -hydrochlorothiazide  (ZESTORETIC ) 20-12.5 MG tablet Take 1 tablet by mouth daily.   [DISCONTINUED] simvastatin  (ZOCOR ) 20 MG tablet Take 1 tablet (20 mg total) by mouth at bedtime.   alendronate  (FOSAMAX ) 70 MG tablet Take 1 tablet (70 mg total) by mouth once a week on an empty stomach with a full glass of water.   lisinopril -hydrochlorothiazide  (ZESTORETIC ) 20-12.5 MG tablet Take 1 tablet by mouth daily.   simvastatin  (ZOCOR ) 20 MG tablet Take 1 tablet (20 mg total) by mouth at bedtime.   [DISCONTINUED] alendronate  (FOSAMAX ) 70 MG tablet Take 1 tablet (70 mg total) by mouth once a week on an empty stomach with a full glass of water. (Patient not taking: Reported on 08/13/2024)   No facility-administered encounter medications on file as of 08/13/2024.    Review of Systems  Review of Systems  Constitutional: Negative.   HENT: Negative.    Cardiovascular: Negative.   Gastrointestinal: Negative.   Allergic/Immunologic: Negative.   Neurological: Negative.   Psychiatric/Behavioral: Negative.       Objective:   BP 134/64   Pulse 64   Resp 16   Wt 98 lb 12.8 oz (44.8 kg)   SpO2 100%   BMI 17.78 kg/m   Wt  Readings from Last 5 Encounters:  08/13/24 98 lb 12.8 oz (44.8 kg)  02/10/24 103 lb 6.4 oz (46.9 kg)  07/25/23 105 lb (47.6 kg)  01/21/23 103 lb 12.8 oz (47.1 kg)  07/23/22 104 lb 9.6 oz (47.4 kg)     Physical Exam Vitals and nursing note reviewed.  Constitutional:      General: She is not in acute distress.    Appearance: She is well-developed.  Cardiovascular:     Rate and Rhythm: Normal rate and regular rhythm.  Pulmonary:     Effort: Pulmonary effort is normal.     Breath sounds: Normal breath sounds.  Skin:        Comments: Small atypical mole noted  Neurological:     Mental  Status: She is alert and oriented to person, place, and time.       Assessment & Plan:   Atypical mole -     Ambulatory referral to Dermatology  Essential hypertension -     Lisinopril -hydroCHLOROthiazide ; Take 1 tablet by mouth daily.  Dispense: 90 tablet; Refill: 3 -     CBC -     Comprehensive metabolic panel with GFR  Pure hypercholesterolemia -     Simvastatin ; Take 1 tablet (20 mg total) by mouth at bedtime.  Dispense: 90 tablet; Refill: 3  Other orders -     Alendronate  Sodium; Take 1 tablet (70 mg total) by mouth once a week on an empty stomach with a full glass of water.  Dispense: 12 tablet; Refill: 3     Return in about 6 months (around 02/11/2025).   Bascom GORMAN Borer, NP 08/13/2024

## 2024-08-14 ENCOUNTER — Other Ambulatory Visit (HOSPITAL_COMMUNITY): Payer: Self-pay

## 2024-08-14 LAB — CBC
Hematocrit: 35.2 % (ref 34.0–46.6)
Hemoglobin: 11.3 g/dL (ref 11.1–15.9)
MCH: 29 pg (ref 26.6–33.0)
MCHC: 32.1 g/dL (ref 31.5–35.7)
MCV: 91 fL (ref 79–97)
Platelets: 254 x10E3/uL (ref 150–450)
RBC: 3.89 x10E6/uL (ref 3.77–5.28)
RDW: 13 % (ref 11.7–15.4)
WBC: 4.7 x10E3/uL (ref 3.4–10.8)

## 2024-08-14 LAB — COMPREHENSIVE METABOLIC PANEL WITH GFR
ALT: 15 IU/L (ref 0–32)
AST: 22 IU/L (ref 0–40)
Albumin: 4.3 g/dL (ref 3.8–4.8)
Alkaline Phosphatase: 85 IU/L (ref 49–135)
BUN/Creatinine Ratio: 25 (ref 12–28)
BUN: 26 mg/dL (ref 8–27)
Bilirubin Total: 0.3 mg/dL (ref 0.0–1.2)
CO2: 24 mmol/L (ref 20–29)
Calcium: 9.2 mg/dL (ref 8.7–10.3)
Chloride: 103 mmol/L (ref 96–106)
Creatinine, Ser: 1.02 mg/dL — ABNORMAL HIGH (ref 0.57–1.00)
Globulin, Total: 2.9 g/dL (ref 1.5–4.5)
Glucose: 89 mg/dL (ref 70–99)
Potassium: 4.3 mmol/L (ref 3.5–5.2)
Sodium: 140 mmol/L (ref 134–144)
Total Protein: 7.2 g/dL (ref 6.0–8.5)
eGFR: 59 mL/min/1.73 — ABNORMAL LOW (ref >59)

## 2024-08-15 ENCOUNTER — Ambulatory Visit (INDEPENDENT_AMBULATORY_CARE_PROVIDER_SITE_OTHER): Admitting: Dermatology

## 2024-08-15 ENCOUNTER — Ambulatory Visit: Payer: Self-pay | Admitting: Nurse Practitioner

## 2024-08-15 ENCOUNTER — Encounter: Payer: Self-pay | Admitting: Dermatology

## 2024-08-15 VITALS — BP 130/65

## 2024-08-15 DIAGNOSIS — D485 Neoplasm of uncertain behavior of skin: Secondary | ICD-10-CM

## 2024-08-15 DIAGNOSIS — B079 Viral wart, unspecified: Secondary | ICD-10-CM

## 2024-08-15 NOTE — Progress Notes (Addendum)
   New Patient Visit   Subjective  Angela [name] is a 73 y.o. female who presents for a NEW PATIENT appointment to be examined for the concerns as listed below.  Spot Check: Pt has a lesion on her back that she would like evaluated. She stated it has been there 3-4 years but it is causing no pain. She would like to know what it is. She believes it may be a skin tag.     No Hx of Bx.  No family Hx of skin cancer.   The following portions of the chart were reviewed this encounter and updated as appropriate: medications, allergies, medical history  Review of Systems:  No other skin or systemic complaints except as noted in HPI or Assessment and Plan.  Objective  Well appearing patient in no apparent distress; mood and affect are within normal limits.   A focused examination was performed of the following areas: lower back   Relevant exam findings are noted in the Assessment and Plan.       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 The lesion is vascular, which explains the bleeding when scratched. No family history of skin cancer and no previous lesions removed. The lesion appears benign, but excision and histopathological confirmation are recommended to rule out malignancy. . - Perform shave excision of the lesion. - Send excised tissue for histopathological examination to confirm benign nature. - Instruct to keep the area clean with soap and water. - Apply Aquaphor to the site and cover with a Band-Aid. - Advise to avoid rubbing or pressure on the area for at least a week. - Provide a sample of Aquaphor and printed aftercare instructions. - Send results via MyChart; call if results are abnormal. NEOPLASM OF UNCERTAIN BEHAVIOR OF SKIN Mid Back Skin / nail  biopsy Type of biopsy: tangential   Informed consent: discussed and consent obtained   Timeout: patient name, date of birth, surgical site, and procedure verified   Procedure prep:  Patient was prepped and draped in usual sterile fashion Prep type:  Isopropyl alcohol Anesthesia: the lesion was anesthetized in a standard fashion   Anesthetic:  1% lidocaine w/ epinephrine 1-100,000 buffered w/ 8.4% NaHCO3 Instrument used: DermaBlade   Hemostasis achieved with: aluminum chloride   Outcome: patient tolerated procedure well   Post-procedure details: sterile dressing applied and wound care instructions given   Dressing type: petrolatum gauze and bandage    Specimen 1 - Surgical pathology Differential Diagnosis: r/o angiofibroma   Check Margins: yes  No follow-ups on file.   Documentation: I have reviewed the above documentation for accuracy and completeness, and I agree with the above.  I, Shirron Maranda, CMA, am acting as scribe for [employer], DO.   Delon Lenis, DO

## 2024-08-15 NOTE — Patient Instructions (Addendum)

## 2024-08-16 ENCOUNTER — Ambulatory Visit: Payer: Self-pay | Admitting: Dermatology

## 2024-08-16 LAB — SURGICAL PATHOLOGY

## 2024-08-20 NOTE — Progress Notes (Signed)
 This patient has no active mychart.  Please call patient with path results. Reassure results were benign and no additional treatment is required.  Thanks!      1. Skin, mid back :       VERRUCA VULGARIS, IRRITATED WITH HEMORRHAGIC SCALE CRUST

## 2024-09-20 ENCOUNTER — Other Ambulatory Visit: Payer: Self-pay

## 2024-09-20 ENCOUNTER — Other Ambulatory Visit (HOSPITAL_COMMUNITY): Payer: Self-pay

## 2024-11-12 ENCOUNTER — Other Ambulatory Visit (HOSPITAL_COMMUNITY): Payer: Self-pay

## 2024-11-12 ENCOUNTER — Other Ambulatory Visit: Payer: Self-pay

## 2024-12-11 ENCOUNTER — Other Ambulatory Visit: Payer: Self-pay

## 2025-02-11 ENCOUNTER — Ambulatory Visit: Payer: Self-pay | Admitting: Nurse Practitioner
